# Patient Record
Sex: Male | Born: 1980
Health system: Southern US, Community
[De-identification: ages and names within clinical notes are randomized; demographics above are authoritative.]

## PROBLEM LIST (undated history)

## (undated) HISTORY — PX: MANDIBLE SURGERY: SHX707

## (undated) HISTORY — PX: ANKLE ARTHROSCOPY WITH OPEN REDUCTION INTERNAL FIXATION (ORIF): SHX5582

---

## 2009-09-26 ENCOUNTER — Ambulatory Visit: Payer: Self-pay | Admitting: Internal Medicine

## 2009-10-31 ENCOUNTER — Ambulatory Visit: Payer: Self-pay | Admitting: Internal Medicine

## 2011-04-15 ENCOUNTER — Ambulatory Visit: Payer: Self-pay | Admitting: Family Medicine

## 2011-08-19 ENCOUNTER — Ambulatory Visit: Payer: Self-pay

## 2011-08-19 LAB — RAPID STREP-A WITH REFLX: Micro Text Report: NEGATIVE

## 2011-08-21 LAB — BETA STREP CULTURE(ARMC)

## 2012-08-14 DIAGNOSIS — Z720 Tobacco use: Secondary | ICD-10-CM | POA: Insufficient documentation

## 2016-01-02 DIAGNOSIS — Z1389 Encounter for screening for other disorder: Secondary | ICD-10-CM | POA: Diagnosis not present

## 2016-01-02 DIAGNOSIS — Z720 Tobacco use: Secondary | ICD-10-CM | POA: Diagnosis not present

## 2016-01-02 DIAGNOSIS — Z13220 Encounter for screening for lipoid disorders: Secondary | ICD-10-CM | POA: Diagnosis not present

## 2016-01-02 DIAGNOSIS — G8929 Other chronic pain: Secondary | ICD-10-CM | POA: Diagnosis not present

## 2016-01-02 DIAGNOSIS — E669 Obesity, unspecified: Secondary | ICD-10-CM | POA: Diagnosis not present

## 2016-01-02 DIAGNOSIS — M25511 Pain in right shoulder: Secondary | ICD-10-CM | POA: Diagnosis not present

## 2016-01-03 DIAGNOSIS — M25512 Pain in left shoulder: Secondary | ICD-10-CM | POA: Diagnosis not present

## 2017-05-08 DIAGNOSIS — M5441 Lumbago with sciatica, right side: Secondary | ICD-10-CM | POA: Diagnosis not present

## 2017-05-08 DIAGNOSIS — F411 Generalized anxiety disorder: Secondary | ICD-10-CM | POA: Diagnosis not present

## 2017-06-12 DIAGNOSIS — F411 Generalized anxiety disorder: Secondary | ICD-10-CM | POA: Diagnosis not present

## 2017-06-12 DIAGNOSIS — Z13220 Encounter for screening for lipoid disorders: Secondary | ICD-10-CM | POA: Diagnosis not present

## 2017-06-12 DIAGNOSIS — Z131 Encounter for screening for diabetes mellitus: Secondary | ICD-10-CM | POA: Diagnosis not present

## 2017-06-12 DIAGNOSIS — Z79899 Other long term (current) drug therapy: Secondary | ICD-10-CM | POA: Diagnosis not present

## 2018-11-20 ENCOUNTER — Observation Stay
Admission: EM | Admit: 2018-11-20 | Discharge: 2018-11-22 | Disposition: A | Discharge status: Home / Self Care | Payer: No Typology Code available for payment source | Attending: Surgery | Admitting: Surgery

## 2018-11-20 ENCOUNTER — Other Ambulatory Visit: Payer: Self-pay

## 2018-11-20 ENCOUNTER — Observation Stay: Payer: No Typology Code available for payment source | Admitting: Anesthesiology

## 2018-11-20 ENCOUNTER — Observation Stay: Payer: No Typology Code available for payment source

## 2018-11-20 ENCOUNTER — Emergency Department: Payer: No Typology Code available for payment source

## 2018-11-20 ENCOUNTER — Encounter: Payer: Self-pay | Admitting: Emergency Medicine

## 2018-11-20 ENCOUNTER — Encounter
Admission: EM | Disposition: A | Discharge status: Home / Self Care | Payer: Self-pay | Source: Home / Self Care | Attending: Emergency Medicine

## 2018-11-20 DIAGNOSIS — Z87891 Personal history of nicotine dependence: Secondary | ICD-10-CM | POA: Insufficient documentation

## 2018-11-20 DIAGNOSIS — K760 Fatty (change of) liver, not elsewhere classified: Secondary | ICD-10-CM | POA: Insufficient documentation

## 2018-11-20 DIAGNOSIS — K8064 Calculus of gallbladder and bile duct with chronic cholecystitis without obstruction: Secondary | ICD-10-CM | POA: Diagnosis not present

## 2018-11-20 DIAGNOSIS — K8050 Calculus of bile duct without cholangitis or cholecystitis without obstruction: Secondary | ICD-10-CM

## 2018-11-20 DIAGNOSIS — K8020 Calculus of gallbladder without cholecystitis without obstruction: Secondary | ICD-10-CM

## 2018-11-20 DIAGNOSIS — K810 Acute cholecystitis: Secondary | ICD-10-CM | POA: Diagnosis present

## 2018-11-20 DIAGNOSIS — R109 Unspecified abdominal pain: Secondary | ICD-10-CM | POA: Diagnosis present

## 2018-11-20 DIAGNOSIS — R1010 Upper abdominal pain, unspecified: Secondary | ICD-10-CM

## 2018-11-20 HISTORY — PX: ERCP: SHX5425

## 2018-11-20 LAB — COMPREHENSIVE METABOLIC PANEL
ALT: 288 U/L — ABNORMAL HIGH (ref 0–44)
AST: 260 U/L — ABNORMAL HIGH (ref 15–41)
Albumin: 4.3 g/dL (ref 3.5–5.0)
Alkaline Phosphatase: 78 U/L (ref 38–126)
Anion gap: 8 (ref 5–15)
BUN: 19 mg/dL (ref 6–20)
CO2: 26 mmol/L (ref 22–32)
Calcium: 9 mg/dL (ref 8.9–10.3)
Chloride: 107 mmol/L (ref 98–111)
Creatinine, Ser: 1.02 mg/dL (ref 0.61–1.24)
GFR calc Af Amer: >60 mL/min (ref >60)
GFR calc non Af Amer: >60 mL/min (ref >60)
Glucose, Bld: 134 mg/dL — ABNORMAL HIGH (ref 70–99)
Potassium: 3.8 mmol/L (ref 3.5–5.1)
Sodium: 141 mmol/L (ref 135–145)
Total Bilirubin: 1.7 mg/dL — ABNORMAL HIGH (ref 0.3–1.2)
Total Protein: 7.2 g/dL (ref 6.5–8.1)

## 2018-11-20 LAB — CBC
HCT: 43.5 % (ref 39.0–52.0)
Hemoglobin: 14.4 g/dL (ref 13.0–17.0)
MCH: 29.3 pg (ref 26.0–34.0)
MCHC: 33.1 g/dL (ref 30.0–36.0)
MCV: 88.4 fL (ref 80.0–100.0)
Platelets: 277 10*3/uL (ref 150–400)
RBC: 4.92 MIL/uL (ref 4.22–5.81)
RDW: 12.5 % (ref 11.5–15.5)
WBC: 6.1 10*3/uL (ref 4.0–10.5)
nRBC: 0 % (ref 0.0–0.2)

## 2018-11-20 LAB — URINALYSIS, COMPLETE (UACMP) WITH MICROSCOPIC
Bacteria, UA: NONE SEEN
Bilirubin Urine: NEGATIVE
Glucose, UA: NEGATIVE mg/dL
Hgb urine dipstick: NEGATIVE
Ketones, ur: NEGATIVE mg/dL
Leukocytes,Ua: NEGATIVE
Nitrite: NEGATIVE
Protein, ur: NEGATIVE mg/dL
Specific Gravity, Urine: 1.021 (ref 1.005–1.030)
Squamous Epithelial / LPF: NONE SEEN (ref 0–5)
pH: 7 (ref 5.0–8.0)

## 2018-11-20 LAB — SURGICAL PCR SCREEN
MRSA, PCR: NEGATIVE
Staphylococcus aureus: POSITIVE — AB

## 2018-11-20 LAB — LIPASE, BLOOD: Lipase: 41 U/L (ref 11–51)

## 2018-11-20 SURGERY — ERCP, WITH INTERVENTION IF INDICATED
Anesthesia: General

## 2018-11-20 MED ORDER — SODIUM CHLORIDE 0.9 % IV BOLUS
1000.0000 mL | Freq: Once | INTRAVENOUS | Status: AC
  Administered 2018-11-20: 1000 mL via INTRAVENOUS

## 2018-11-20 MED ORDER — INDOMETHACIN 50 MG RE SUPP
100.0000 mg | Freq: Once | RECTAL | Status: AC
  Administered 2018-11-20: 15:00:00 100 mg via RECTAL

## 2018-11-20 MED ORDER — HYDROMORPHONE HCL 1 MG/ML IJ SOLN
0.5000 mg | Freq: Once | INTRAMUSCULAR | Status: AC
  Administered 2018-11-20: 0.5 mg via INTRAVENOUS
  Filled 2018-11-20: qty 1

## 2018-11-20 MED ORDER — GADOBUTROL 1 MMOL/ML IV SOLN
8.0000 mL | Freq: Once | INTRAVENOUS | Status: AC | PRN
  Administered 2018-11-20: 8 mL via INTRAVENOUS

## 2018-11-20 MED ORDER — DOCUSATE SODIUM 100 MG PO CAPS: 100.0000 mg | ORAL_CAPSULE | Freq: Two times a day (BID) | ORAL | Status: DC | PRN

## 2018-11-20 MED ORDER — LIDOCAINE HCL (CARDIAC) PF 100 MG/5ML IV SOSY
PREFILLED_SYRINGE | INTRAVENOUS | Status: DC | PRN
  Administered 2018-11-20: 60 mg via INTRAVENOUS

## 2018-11-20 MED ORDER — ONDANSETRON HCL 4 MG/2ML IJ SOLN
4.0000 mg | Freq: Once | INTRAMUSCULAR | Status: AC
  Administered 2018-11-20: 4 mg via INTRAVENOUS
  Filled 2018-11-20: qty 2

## 2018-11-20 MED ORDER — PROPOFOL 500 MG/50ML IV EMUL
INTRAVENOUS | Status: DC | PRN
  Administered 2018-11-20: 150 ug/kg/min via INTRAVENOUS

## 2018-11-20 MED ORDER — SODIUM CHLORIDE 0.9 % IV SOLN: INTRAVENOUS | Status: DC

## 2018-11-20 MED ORDER — LACTATED RINGERS IV SOLN
INTRAVENOUS | Status: DC
  Administered 2018-11-20: 13:00:00 via INTRAVENOUS

## 2018-11-20 MED ORDER — LACTATED RINGERS IV SOLN
INTRAVENOUS | Status: DC
  Administered 2018-11-20 – 2018-11-21 (×3): via INTRAVENOUS

## 2018-11-20 MED ORDER — PIPERACILLIN-TAZOBACTAM 3.375 G IVPB 30 MIN
3.3750 g | Freq: Once | INTRAVENOUS | Status: AC
  Administered 2018-11-20: 3.375 g via INTRAVENOUS
  Filled 2018-11-20: qty 50

## 2018-11-20 MED ORDER — PROPOFOL 10 MG/ML IV BOLUS
INTRAVENOUS | Status: AC
  Filled 2018-11-20: qty 20

## 2018-11-20 MED ORDER — LIDOCAINE HCL (PF) 2 % IJ SOLN
INTRAMUSCULAR | Status: AC
  Filled 2018-11-20: qty 10

## 2018-11-20 MED ORDER — SODIUM CHLORIDE 0.9 % IV SOLN
INTRAVENOUS | Status: DC | PRN
  Administered 2018-11-20 – 2018-11-21 (×2): via INTRAVENOUS

## 2018-11-20 MED ORDER — ONDANSETRON 4 MG PO TBDP: 4.0000 mg | ORAL_TABLET | Freq: Three times a day (TID) | ORAL | 0 refills | Status: DC | PRN

## 2018-11-20 MED ORDER — ONDANSETRON HCL 4 MG/2ML IJ SOLN: 4.0000 mg | Freq: Four times a day (QID) | INTRAMUSCULAR | Status: DC | PRN

## 2018-11-20 MED ORDER — FENTANYL CITRATE (PF) 100 MCG/2ML IJ SOLN
50.0000 ug | Freq: Once | INTRAMUSCULAR | Status: AC
  Administered 2018-11-20: 50 ug via INTRAVENOUS
  Filled 2018-11-20: qty 2

## 2018-11-20 MED ORDER — MORPHINE SULFATE (PF) 2 MG/ML IV SOLN
2.0000 mg | INTRAVENOUS | Status: DC | PRN
  Administered 2018-11-21 – 2018-11-22 (×4): 2 mg via INTRAVENOUS
  Filled 2018-11-20 (×4): qty 1

## 2018-11-20 MED ORDER — MIDAZOLAM HCL 2 MG/2ML IJ SOLN
INTRAMUSCULAR | Status: DC | PRN
  Administered 2018-11-20: 2 mg via INTRAVENOUS

## 2018-11-20 MED ORDER — MIDAZOLAM HCL 2 MG/2ML IJ SOLN
INTRAMUSCULAR | Status: AC
  Filled 2018-11-20: qty 2

## 2018-11-20 MED ORDER — INDOMETHACIN 50 MG RE SUPP
RECTAL | Status: AC
  Administered 2018-11-20: 100 mg via RECTAL
  Filled 2018-11-20: qty 2

## 2018-11-20 MED ORDER — PROPOFOL 10 MG/ML IV BOLUS
INTRAVENOUS | Status: DC | PRN
  Administered 2018-11-20 (×2): 40 mg via INTRAVENOUS

## 2018-11-20 MED ORDER — ONDANSETRON 4 MG PO TBDP
4.0000 mg | ORAL_TABLET | Freq: Four times a day (QID) | ORAL | Status: DC | PRN
  Filled 2018-11-20: qty 1

## 2018-11-20 MED ORDER — SODIUM CHLORIDE 0.9 % IV SOLN
2.0000 g | INTRAVENOUS | Status: DC
  Administered 2018-11-20: 18:00:00 2 g via INTRAVENOUS
  Filled 2018-11-20: qty 20
  Filled 2018-11-20: qty 2

## 2018-11-20 MED ORDER — OXYCODONE-ACETAMINOPHEN 5-325 MG PO TABS: 1.0000 | ORAL_TABLET | ORAL | 0 refills | Status: DC | PRN

## 2018-11-20 NOTE — ED Triage Notes (Addendum)
Patient ambulatory to triage with steady gait, without difficulty, appears uncomfortable; c/o generalized abd pain x 2wks, worse this morning radiating around into back with no accomp symptoms

## 2018-11-20 NOTE — Op Note (Signed)
[facility] Gastroenterology Patient Name: Steve FiddlerBrad Campbell Procedure Date: 11/20/2018 3:04 PM MRN: [id number] Account #: [id number] Date of Birth: 08-16-1980 Admit Type: Outpatient Age: 38 Room: [facility] ENDO ROOM 4 Gender: Male Note Status: Finalized Procedure:            ERCP Indications:          Bile duct stone(s) Providers:            Midge Miniumarren Derward Marple MD, MD Medicines:            Propofol per Anesthesia Complications:        No immediate complications. Procedure:            Pre-Anesthesia Assessment:                       - Prior to the procedure, a History and Physical was                        performed, and patient medications and allergies were                        reviewed. The patient's tolerance of previous                        anesthesia was also reviewed. The risks and benefits of                        the procedure and the sedation options and risks were                        discussed with the patient. All questions were                        answered, and informed consent was obtained. Prior                        Anticoagulants: The patient has taken no previous                        anticoagulant or antiplatelet agents. ASA Grade                        Assessment: II - A patient with mild systemic disease.                        After reviewing the risks and benefits, the patient was                        deemed in satisfactory condition to undergo the                        procedure.                       After obtaining informed consent, the scope was passed                        under direct vision. Throughout the procedure, the                        patient's blood pressure, pulse, and  oxygen saturations                        were monitored continuously. The Duodenoscope was                        introduced through the mouth, and used to inject                        contrast into and used to inject contrast into the bile         duct. The ERCP was accomplished without difficulty. The                        patient tolerated the procedure well. Findings:      The scout film was normal. The esophagus was successfully intubated       under direct vision. The scope was advanced to a normal major papilla in       the descending duodenum without detailed examination of the pharynx,       larynx and associated structures, and upper GI tract. The upper GI tract       was grossly normal. The bile duct was deeply cannulated with the       short-nosed traction sphincterotome. Contrast was injected. I personally       interpreted the bile duct images. There was brisk flow of contrast       through the ducts. Image quality was excellent. Contrast extended to the       entire biliary tree. The lower third of the main bile duct contained       three stones. A wire was passed into the biliary tree. A 7 mm biliary       sphincterotomy was made with a traction (standard) sphincterotome using       ERBE electrocautery. There was no post-sphincterotomy bleeding. The       biliary tree was swept with a 15 mm balloon starting at the bifurcation.       Three stones were removed. No stones remained. Impression:           - Choledocholithiasis was found. Complete removal was                        accomplished by biliary sphincterotomy and balloon                        extraction.                       - A biliary sphincterotomy was performed.                       - The biliary tree was swept. Recommendation:       - Return patient to hospital ward for ongoing care.                       - Continue present medications.                       - Clear liquid diet today.                       - Watch for pancreatitis, bleeding, perforation, and  cholangitis. Procedure Code(s):    --- Professional ---                       [phone], Endoscopic retrograde cholangiopancreatography                        (ERCP); with  removal of calculi/debris from                        biliary/pancreatic duct(s)                       [id number], Endoscopic retrograde cholangiopancreatography                        (ERCP); with sphincterotomy/papillotomy                       [phone], Endoscopic catheterization of the biliary ductal                        system, radiological supervision and interpretation Diagnosis Code(s):    --- Professional ---                       K80.50, Calculus of bile duct without cholangitis or                        cholecystitis without obstruction CPT copyright 2019 American [employer] Association. All rights reserved. The codes documented in this report are preliminary and upon coder review may  be revised to meet current compliance requirements. Midge Minium MD, MD 11/20/2018 3:55:04 PM This report has been signed electronically. Number of Addenda: 0 Note Initiated On: 11/20/2018 3:04 PM      [facility]

## 2018-11-20 NOTE — H&P (Signed)
Subjective:   CC: cholecystitis  HPI:  Steve Campbell is a 38 y.o. male [facility]  Symptoms were first noted 2 weeks ago. Pain is sharp, crampy, located in epigastric area.  Took some antacids and seems to have improved initial episode.  Intermittent pain until 5hrs ago, with acute exacerbation prompting ED visit.  Associated with nothing specific, exacerbated by nothing specific.  No URI symptoms, afebrile, and no contact with covid positive patient     Past Medical History: anxiety  Past Surgical History: no abdominal procedures  Family History: not related to CC  Social History: history of chewing tobacco  Current Medications: none reported  Allergies:  Allergies as of 11/20/2018  . (No Known Allergies)    ROS:  General: Denies weight loss, weight gain, fatigue, fevers, chills, and night sweats. Eyes: Denies blurry vision, double vision, eye pain, itchy eyes, and tearing. Ears: Denies hearing loss, earache, and ringing in ears. Nose: Denies sinus pain, congestion, infections, runny nose, and nosebleeds. Mouth/throat: Denies hoarseness, sore throat, bleeding gums, and difficulty swallowing. Heart: Denies chest pain, palpitations, racing heart, irregular heartbeat, leg pain or swelling, and decreased activity tolerance. Respiratory: Denies breathing difficulty, shortness of breath, wheezing, cough, and sputum. GI: Denies change in appetite, heartburn, nausea, vomiting, constipation, diarrhea, and blood in stool. GU: Denies difficulty urinating, pain with urinating, urgency, frequency, blood in urine. Musculoskeletal: Denies joint stiffness, pain, swelling, muscle weakness. Skin: Denies rash, itching, mass, tumors, sores, and boils Neurologic: Denies headache, fainting, dizziness, seizures, numbness, and tingling. Psychiatric: Denies depression, anxiety, difficulty sleeping, and memory loss. Endocrine: Denies heat or cold  intolerance, and increased thirst or urination. Blood/lymph: Denies easy bruising, easy bruising, and swollen glands     Objective:     BP (!) 136/57 (BP Location: Right Arm)   Pulse 65   Temp 97.9 F (36.6 C) (Oral)   Resp 18   Ht 5\' 10"  (1.778 m)   Wt 88.5 kg   SpO2 99%   BMI 27.98 kg/m    Constitutional :  alert, cooperative, appears stated age and no distress  Lymphatics/Throat:  no asymmetry, masses, or scars  Respiratory:  clear to auscultation bilaterally  Cardiovascular:  regular rate and rhythm  Gastrointestinal: soft, but moderate tenderness in epigastric area with some guarding.   Musculoskeletal: Steady gait and movement  Skin: Cool and moist  Psychiatric: Normal affect, non-agitated, not confused       LABS:  CMP Latest Ref Rng & Units 11/20/2018  Glucose 70 - 99 mg/dL 130(Q134(H)  BUN 6 - 20 mg/dL 19  Creatinine 6.570.61 - 8.461.24 mg/dL 9.621.02  Sodium 952135 - 841145 mmol/L 141  Potassium 3.5 - 5.1 mmol/L 3.8  Chloride 98 - 111 mmol/L 107  CO2 22 - 32 mmol/L 26  Calcium 8.9 - 10.3 mg/dL 9.0  Total Protein 6.5 - 8.1 g/dL 7.2  Total Bilirubin 0.3 - 1.2 mg/dL 3.2(G1.7(H)  Alkaline Phos 38 - 126 U/L 78  AST 15 - 41 U/L 260(H)  ALT 0 - 44 U/L 288(H)   CBC Latest Ref Rng & Units 11/20/2018  WBC 4.0 - 10.5 K/uL 6.1  Hemoglobin 13.0 - 17.0 g/dL 40.114.4  Hematocrit 02.739.0 - 52.0 % 43.5  Platelets 150 - 400 K/uL 277     RADS: CLINICAL DATA:  38 year old male with epigastric and right upper quadrant pain.  EXAM: ULTRASOUND ABDOMEN LIMITED RIGHT UPPER QUADRANT  COMPARISON:  None.  FINDINGS: Gallbladder:  Numerous  shadowing echogenic gallstones. Multiple gallstones in the gallbladder neck (image 20) individually estimated at 13 millimeters diameter (image 4). Superimposed sludge. Gallbladder wall thickness remains normal at 2 millimeters. No sonographic Murphy sign elicited. No pericholecystic fluid.  Common bile duct:  Diameter: 5 millimeters,  normal.  Liver:  Increased liver echogenicity (image 32). No discrete liver lesion. No intrahepatic biliary ductal dilatation. Portal vein is patent on color Doppler imaging with normal direction of blood flow towards the liver.  Other findings: Negative visible right kidney.  IMPRESSION: 1. Positive for cholelithiasis with multiple gallstones in the gallbladder neck. No strong evidence of acute cholecystitis at this time. 2. CBD remains within normal limits, no evidence of bile duct obstruction. 3. Evidence of hepatic steatosis.   Electronically Signed   By: Odessa Fleming M.D.   On: 11/20/2018 06:40 Assessment:      Acute cholecystitis, possible choledocolithiasis  Plan:     Due to elevated LFTs, will obtain MRCP to ensure no CBD stones prior to lap chole.  GI consult if positive MRCP   Discussed the risk of surgery including post-op infxn, seroma, biloma, chronic pain, poor-delayed wound healing, retained gallstone, conversion to open procedure, post-op SBO or ileus, and need for additional procedures to address said risks.  The risks of general anesthetic including MI, CVA, sudden death or even reaction to anesthetic medications also discussed. Alternatives include continued observation.  Benefits include possible symptom relief, prevention of complications including acute cholecystitis, pancreatitis.  Typical post operative recovery of 3-5 days rest, continued pain in area and incision sites, possible loose stools up to 4-6 weeks, also discussed.  The patient understands the risks, any and all questions were answered to the patient's satisfaction.

## 2018-11-20 NOTE — Anesthesia Postprocedure Evaluation (Signed)
Anesthesia Post Note  Patient: Steve Campbell  Procedure(s) Performed: ENDOSCOPIC RETROGRADE CHOLANGIOPANCREATOGRAPHY (ERCP) (N/A )  Patient location during evaluation: PACU Anesthesia Type: General Level of consciousness: awake and alert Pain management: pain level controlled Vital Signs Assessment: post-procedure vital signs reviewed and stable Respiratory status: spontaneous breathing, nonlabored ventilation, respiratory function stable and patient connected to nasal cannula oxygen Cardiovascular status: blood pressure returned to baseline and stable Postop Assessment: no apparent nausea or vomiting Anesthetic complications: no     Last Vitals:  Vitals:   11/20/18 1735 11/20/18 2045  BP:  113/66  Pulse: (!) 47 (!) 51  Resp:  15  Temp:  36.7 C  SpO2:  97%    Last Pain:  Vitals:   11/20/18 2045  TempSrc: Oral  PainSc:                  Kiyani Jernigan S

## 2018-11-20 NOTE — ED Notes (Signed)
ED TO INPATIENT HANDOFF REPORT  ED Nurse Name and Phone #: Trenda Corliss [phone]  S Name/Age/Gender Steve Campbell 38 y.o. male Room/Bed: ED04A/ED04A  Code Status   Code Status: Not on file  Home/SNF/Other Home Patient oriented to: self, place, time and situation Is this baseline? Yes   Triage Complete: Triage complete  Chief Complaint abdominal Pain  Triage Note Patient ambulatory to triage with steady gait, without difficulty, appears uncomfortable; c/o generalized abd pain x 2wks, worse this morning radiating around into back with no accomp symptoms   Allergies No Known Allergies  Level of Care/Admitting Diagnosis ED Disposition    ED Disposition Condition Comment   Admit  [facility]ospital [facility] [[id number]]  Level of Care: Med-Surg [16]  Covid Evaluation: N/A  Diagnosis: Acute cholecystitis [575.0.ICD-9-CM]  Admitting Physician: Sung AmabileSAKAI, ISAMI [[id number]]  Attending Physician: Sung AmabileSAKAI, ISAMI [phone][[id number]]  PT Class (Do Not Modify): Observation [104]  PT Acc Code (Do Not Modify): Observation [10022]       B Medical/Surgery History History reviewed. No pertinent past medical history. History reviewed. No pertinent surgical history.   A IV Location/Drains/Wounds Patient Lines/Drains/Airways Status   Active Line/Drains/Airways    Name:   Placement date:   Placement time:   Site:   Days:   Peripheral IV 11/20/18 Left Hand   11/20/18    0547    Hand   less than 1          Intake/Output Last 24 hours  Intake/Output Summary (Last 24 hours) at 11/20/2018 1015 Last data filed at 11/20/2018 0912 Gross per 24 hour  Intake 1000 ml  Output -  Net 1000 ml    Labs/Imaging Results for orders placed or performed during the hospital encounter of 11/20/18 (from the past 48 hour(s))  CBC     Status: None   Collection Time: 11/20/18  5:10 AM  Result Value Ref Range   WBC 6.1 4.0 - 10.5 K/uL   RBC 4.92 4.22 - 5.81 MIL/uL   Hemoglobin 14.4 13.0 - 17.0 g/dL   HCT  19.143.5 47.839.0 - 29.552.0 %   MCV 88.4 80.0 - 100.0 fL   MCH 29.3 26.0 - 34.0 pg   MCHC 33.1 30.0 - 36.0 g/dL   RDW 62.112.5 30.811.5 - 65.715.5 %   Platelets 277 150 - 400 K/uL   nRBC 0.0 0.0 - 0.2 %    Comment: Performed at [facility], [address] [place], [place] [address]  Urinalysis, Complete w Microscopic     Status: Abnormal   Collection Time: 11/20/18  5:10 AM  Result Value Ref Range   Color, Urine YELLOW (A) YELLOW   APPearance CLOUDY (A) CLEAR   Specific Gravity, Urine 1.021 1.005 - 1.030   pH 7.0 5.0 - 8.0   Glucose, UA NEGATIVE NEGATIVE mg/dL   Hgb urine dipstick NEGATIVE NEGATIVE   Bilirubin Urine NEGATIVE NEGATIVE   Ketones, ur NEGATIVE NEGATIVE mg/dL   Protein, ur NEGATIVE NEGATIVE mg/dL   Nitrite NEGATIVE NEGATIVE   Leukocytes,Ua NEGATIVE NEGATIVE   WBC, UA 0-5 0 - 5 WBC/hpf   Bacteria, UA NONE SEEN NONE SEEN   Squamous Epithelial / LPF NONE SEEN 0 - 5   Mucus PRESENT    Amorphous Crystal PRESENT     Comment: Performed at [facility], [address] [place], [place] [address]  Comprehensive metabolic panel     Status: Abnormal   Collection Time: 11/20/18  6:47 AM  Result Value Ref Range  Sodium 141 135 - 145 mmol/L   Potassium 3.8 3.5 - 5.1 mmol/L   Chloride 107 98 - 111 mmol/L   CO2 26 22 - 32 mmol/L   Glucose, Bld 134 (H) 70 - 99 mg/dL   BUN 19 6 - 20 mg/dL   Creatinine, Ser 1.61 0.61 - 1.24 mg/dL   Calcium 9.0 8.9 - 09.6 mg/dL   Total Protein 7.2 6.5 - 8.1 g/dL   Albumin 4.3 3.5 - 5.0 g/dL   AST 045 (H) 15 - 41 U/L   ALT 288 (H) 0 - 44 U/L   Alkaline Phosphatase 78 38 - 126 U/L   Total Bilirubin 1.7 (H) 0.3 - 1.2 mg/dL   GFR calc non Af Amer >60 >60 mL/min   GFR calc Af Amer >60 >60 mL/min   Anion gap 8 5 - 15    Comment: Performed at [facility], [address] [place], [place] [address]  Lipase, blood     Status: None   Collection Time: 11/20/18  6:47 AM  Result Value Ref Range   Lipase 41 11 - 51 U/L    Comment: Performed  at [facility], [address], [place], [place] [address]   US Abdomen Limited Ruq  Result Date: 11/20/2018 CLINICAL DATA:  38 year old male with epigastric and right upper quadrant pain. EXAM: ULTRASOUND ABDOMEN LIMITED RIGHT UPPER QUADRANT COMPARISON:  None. FINDINGS: Gallbladder: Numerous shadowing echogenic gallstones. Multiple gallstones in the gallbladder neck (image 20) individually estimated at 13 millimeters diameter (image 4). Superimposed sludge. Gallbladder wall thickness remains normal at 2 millimeters. No sonographic Murphy sign elicited. No pericholecystic fluid. Common bile duct: Diameter: 5 millimeters, normal. Liver: Increased liver echogenicity (image 32). No discrete liver lesion. No intrahepatic biliary ductal dilatation. Portal vein is patent on color Doppler imaging with normal direction of blood flow towards the liver. Other findings: Negative visible right kidney. IMPRESSION: 1. Positive for cholelithiasis with multiple gallstones in the gallbladder neck. No strong evidence of acute cholecystitis at this time. 2. CBD remains within normal limits, no evidence of bile duct obstruction. 3. Evidence of hepatic steatosis. Electronically Signed   By: Odessa Fleming M.D.   On: 11/20/2018 06:40    Pending Labs [employer] (From admission, onward)    Start     Ordered   Signed and Held  HIV antibody (Routine Testing)  Once,   R     Signed and Held   Signed and Interior and spatial designer  Daily,   R     Signed and Held   Signed and Held  Magnesium  Daily,   R     Signed and Held   Signed and Held  Phosphorus  Daily,   R     Signed and Held   Signed and Held  CBC  Daily,   R     Signed and Held   Signed and Held  Hepatic function panel  Daily,   R     Signed and Held          Vitals/Pain Today's Vitals   11/20/18 0823 11/20/18 0830 11/20/18 0900 11/20/18 1005  BP:  124/65 116/74   Pulse:  71 77   Resp:      Temp:      TempSrc:      SpO2:  99% 97%   Weight:       Height:      PainSc: 4    0-No pain  Isolation Precautions No active isolations  Medications Medications  sodium chloride 0.9 % bolus 1,000 mL (0 mLs Intravenous Stopped 11/20/18 0912)  fentaNYL (SUBLIMAZE) injection 50 mcg (50 mcg Intravenous Given 11/20/18 0548)  ondansetron (ZOFRAN) injection 4 mg (4 mg Intravenous Given 11/20/18 0553)  HYDROmorphone (DILAUDID) injection 0.5 mg (0.5 mg Intravenous Given 11/20/18 0819)  piperacillin-tazobactam (ZOSYN) IVPB 3.375 g (0 g Intravenous Stopped 11/20/18 0912)    Mobility walks Low fall risk   Focused Assessments    R Recommendations: See Admitting Provider Note  Report given to:   Additional Notes:

## 2018-11-20 NOTE — ED Notes (Addendum)
Pt returned from ultrasound. Resting on stretcher quietly. States his pain has improved. No distress noted. Provided for safety and comfort and will continue to assess.

## 2018-11-20 NOTE — ED Notes (Signed)
Patient pacing around room. Patient made aware he cannot ambulate around room once given dilaudid. Patient reports understanding/agreed

## 2018-11-20 NOTE — Progress Notes (Signed)
   11/20/18 1300  Clinical Encounter Type  Visited With Patient  Visit Type Initial  Ch was rounding. Pt said he was in pain but doing better since coming to the hospital. Pt has good family support. Ch let him know the chaplain service was available at all times.

## 2018-11-20 NOTE — Consult Note (Signed)
Midge Minium, MD [facility]  [address] [place], [place] [address] Phone: [phone] Fax : [phone]  Consultation  Referring Provider:     Dr. Tonna Boehringer Primary Care Physician:  Nira Retort Primary Gastroenterologist: Gentry Fitz          Reason for Consultation:     Choledocholithiasis  Date of Admission:  11/20/2018 Date of Consultation:  11/20/2018         HPI:   Steve Campbell is a 38 y.o. male who was admitted for sharp crampy pain in the epigastric area.  The patient was noted to have abnormal liver enzymes and was sent for an MRCP.  The MRCP showed gallstones without any sign of acute cholecystitis but there was biliary ductal dilatation with 2 tiny subcentimeter stones within the common bile duct.  The patient reports that he was having intermittent pain prior to coming to the ER.  The patient's liver enzymes showed an AST of 260 with an ALT of 288 and a total bilirubin of 1.7.  The patient denies ever having symptoms like this before.  Lipase was normal at 41.  Now being asked to see the patient for an ERCP.  History reviewed. No pertinent past medical history.  History reviewed. No pertinent surgical history.  Prior to Admission medications   Medication Sig Start Date End Date Taking? Authorizing Provider  ondansetron (ZOFRAN ODT) 4 MG disintegrating tablet Take 1 tablet (4 mg total) by mouth every 8 (eight) hours as needed for nausea or vomiting. 11/20/18   Irean Hong, MD  oxyCODONE-acetaminophen (PERCOCET/ROXICET) 5-325 MG tablet Take 1 tablet by mouth every 4 (four) hours as needed for severe pain. 11/20/18   Irean Hong, MD    History reviewed. No pertinent family history.   Social History   Tobacco Use   Smoking status: Not on file  Substance Use Topics   Alcohol use: Not on file   Drug use: Not on file    Allergies as of 11/20/2018   (No Known Allergies)    Review of Systems:    All systems reviewed and negative except where noted in  HPI.   Physical Exam:  Vital signs in last 24 hours: Temp:  [97.7 F (36.5 C)-97.9 F (36.6 C)] 97.7 F (36.5 C) (04/23 1104) Pulse Rate:  [57-78] 58 (04/23 1104) Resp:  [16-22] 16 (04/23 1104) BP: (116-143)/(57-93) 131/76 (04/23 1104) SpO2:  [97 %-100 %] 99 % (04/23 1104) Weight:  [88.5 kg] 88.5 kg (04/23 0446)   General:   Pleasant, cooperative in NAD Head:  Normocephalic and atraumatic. Eyes:   No icterus.   Conjunctiva pink. PERRLA. Ears:  Normal auditory acuity. Neck:  Supple; no masses or thyroidomegaly Lungs: Respirations even and unlabored. Lungs clear to auscultation bilaterally.   No wheezes, crackles, or rhonchi.  Heart:  Regular rate and rhythm;  Without murmur, clicks, rubs or gallops Abdomen:  Soft, nondistended, nontender. Normal bowel sounds. No appreciable masses or hepatomegaly.  No rebound or guarding.  Rectal:  Not performed. Msk:  Symmetrical without gross deformities.    Extremities:  Without edema, cyanosis or clubbing. Neurologic:  Alert and oriented x3;  grossly normal neurologically. Skin:  Intact without significant lesions or rashes. Cervical Nodes:  No significant cervical adenopathy. Psych:  Alert and cooperative. Normal affect.  LAB RESULTS: Recent Labs    11/20/18 0510  WBC 6.1  HGB 14.4  HCT 43.5  PLT 277   BMET Recent Labs    11/20/18  0647  NA 141  K 3.8  CL 107  CO2 26  GLUCOSE 134*  BUN 19  CREATININE 1.02  CALCIUM 9.0   LFT Recent Labs    11/20/18 0647  PROT 7.2  ALBUMIN 4.3  AST 260*  ALT 288*  ALKPHOS 78  BILITOT 1.7*   PT/INR No results for input(s): LABPROT, INR in the last 72 hours.  STUDIES: Mr Abdomen Mrcp Vivien RossettiW Wo Contast  Result Date: 11/20/2018 CLINICAL DATA:  Right upper quadrant pain. Cholelithiasis. Elevated liver function tests. EXAM: MRI ABDOMEN WITHOUT AND WITH CONTRAST (INCLUDING MRCP) TECHNIQUE: Multiplanar multisequence MR imaging of the abdomen was performed both before and after the  administration of intravenous contrast. Heavily T2-weighted images of the biliary and pancreatic ducts were obtained, and three-dimensional MRCP images were rendered by post processing. CONTRAST:  8 mL Gadavist COMPARISON:  None. FINDINGS: Lower chest: No acute findings. Hepatobiliary: No hepatic masses identified. Normal hepatic signal intensity. Numerous tiny gallstones are seen nearly completely filling the gallbladder, however there is no evidence of gallbladder wall thickening or pericholecystic inflammatory changes. Some respiratory motion artifact is noted. Mild dilatation of common bile duct is seen measuring 6 mm in diameter. Two sub-cm calculi are seen within the distal common bile duct. Pancreas: No mass or inflammatory changes. No evidence of pancreatic ductal dilatation or pancreas divisum. Spleen:  Within normal limits in size and appearance. Adrenals/Urinary Tract: No masses identified. No evidence of hydronephrosis. Stomach/Bowel: Visualized portion unremarkable. Vascular/Lymphatic: No pathologically enlarged lymph nodes identified. No abdominal aortic aneurysm. Other:  None. Musculoskeletal:  No suspicious bone lesions identified. IMPRESSION: Cholelithiasis.  No radiographic evidence of acute cholecystitis. Mild biliary ductal dilatation measuring 6 mm, with 2 tiny sub-cm calculi within the distal common bile duct. Electronically Signed   By: Myles RosenthalJohn  Stahl M.D.   On: 11/20/2018 12:21   [place] Abdomen Limited Ruq  Result Date: 11/20/2018 CLINICAL DATA:  38 year old male with epigastric and right upper quadrant pain. EXAM: ULTRASOUND ABDOMEN LIMITED RIGHT UPPER QUADRANT COMPARISON:  None. FINDINGS: Gallbladder: Numerous shadowing echogenic gallstones. Multiple gallstones in the gallbladder neck (image 20) individually estimated at 13 millimeters diameter (image 4). Superimposed sludge. Gallbladder wall thickness remains normal at 2 millimeters. No sonographic Murphy sign elicited. No pericholecystic  fluid. Common bile duct: Diameter: 5 millimeters, normal. Liver: Increased liver echogenicity (image 32). No discrete liver lesion. No intrahepatic biliary ductal dilatation. Portal vein is patent on color Doppler imaging with normal direction of blood flow towards the liver. Other findings: Negative visible right kidney. IMPRESSION: 1. Positive for cholelithiasis with multiple gallstones in the gallbladder neck. No strong evidence of acute cholecystitis at this time. 2. CBD remains within normal limits, no evidence of bile duct obstruction. 3. Evidence of hepatic steatosis. Electronically Signed   By: Odessa FlemingH  Hall M.D.   On: 11/20/2018 06:40      Impression / Plan:   Assessment: Active Problems:   Acute cholecystitis Common bile duct stones and gallstones  Steve Campbell is a 38 y.o. y/o male with a positive MRCP showing 2 stones in the common bile duct.  The patient is relatively pain-free at the present time.    Plan: The patient will be set up for an ERCP for today to remove those 2 stones seen on the MRCP.  The patient has been told of the risks including bleeding perforation pancreatitis and inability to cannulate the common bile duct.  He has also been informed that severe pancreatitis can be life-threatening.  The patient  has agreed to proceed with the procedure.   Thank you for involving me in the care of this patient.      LOS: 0 days   Midge Minium, MD  11/20/2018, 2:57 PM    Note: This dictation was prepared with Dragon dictation along with smaller phrase technology. Any transcriptional errors that result from this process are unintentional.

## 2018-11-20 NOTE — ED Notes (Signed)
Pt up to restroom without assistance. C/o severe abd pain. Restless.

## 2018-11-20 NOTE — Progress Notes (Signed)
   11/20/18 1735  Vitals  Pulse Rate (!) 47   Dr. Tonna Boehringer made aware that patient HR is staying in the 40s since patient return from ERCP.  Patient is asymptomatic. Verbal order for cardiac monitoring.  Orson Ape, BSN

## 2018-11-20 NOTE — ED Notes (Signed)
Dr. Dolores Frame at the bedside for pt evaluation. Pt very restless.

## 2018-11-20 NOTE — Anesthesia Preprocedure Evaluation (Signed)
Anesthesia Evaluation  Patient identified by MRN, date of birth, ID band Patient awake    Reviewed: Allergy & Precautions, NPO status , Patient's Chart, lab work & pertinent test results, reviewed documented beta blocker date and time   Airway Mallampati: II  TM Distance: >3 FB     Dental  (+) Chipped   Pulmonary           Cardiovascular      Neuro/Psych    GI/Hepatic   Endo/Other    Renal/GU      Musculoskeletal   Abdominal   Peds  Hematology   Anesthesia Other Findings   Reproductive/Obstetrics                             Anesthesia Physical Anesthesia Plan  ASA: II  Anesthesia Plan: General   Post-op Pain Management:    Induction: Intravenous  PONV Risk Score and Plan:   Airway Management Planned: Oral ETT  Additional Equipment:   Intra-op Plan:   Post-operative Plan:   Informed Consent: I have reviewed the patients History and Physical, chart, labs and discussed the procedure including the risks, benefits and alternatives for the proposed anesthesia with the patient or authorized representative who has indicated his/her understanding and acceptance.     Plan Discussed with: CRNA  Anesthesia Plan Comments:         Anesthesia Quick Evaluation  

## 2018-11-20 NOTE — Discharge Instructions (Addendum)
Laparoscopic Cholecystectomy, Care After This sheet gives you information about how to care for yourself after your procedure. Your doctor may also give you more specific instructions. If you have problems or questions, contact your doctor. Follow these instructions at home: Care for cuts from surgery (incisions)   Follow instructions from your doctor about how to take care of your cuts from surgery. Make sure you: ? Wash your hands with soap and water before you change your bandage (dressing). If you cannot use soap and water, use hand sanitizer. ? Change your bandage as told by your doctor. ? Leave stitches (sutures), skin glue, or skin tape (adhesive) strips in place. They may need to stay in place for 2 weeks or longer. If tape strips get loose and curl up, you may trim the loose edges. Do not remove tape strips completely unless your doctor says it is okay.  Do not take baths, swim, or use a hot tub until your doctor says it is okay. OK TO SHOWER IN 48HRS.  Check your surgical cut area every day for signs of infection. Check for: ? More redness, swelling, or pain. ? More fluid or blood. ? Warmth. ? Pus or a bad smell. Activity  Do not drive or use heavy machinery while taking prescription pain medicine.  Do not play contact sports until your doctor says it is okay.  Do not drive for 24 hours if you were given a medicine to help you relax (sedative).  Rest as needed. Do not return to work or school until your doctor says it is okay.  General instructions   Neurontin and advil as needed for discomfort.  Simethicone for your gas pain.   Use narcotics, if prescribed, only when neurontin and motrin is not enough to control pain.   Advil up to 800mg  per dose every 8hrs as needed for pain.    To prevent or treat constipation while you are taking prescription pain medicine, your doctor may recommend that you: ? Drink enough fluid to keep your pee (urine) clear or pale yellow. ? Take  over-the-counter or prescription medicines. ? Eat foods that are high in fiber, such as fresh fruits and vegetables, whole grains, and beans. ? Limit foods that are high in fat and processed sugars, such as fried and sweet foods. Contact a doctor if:  You develop a rash.  You have more redness, swelling, or pain around your surgical cuts.  You have more fluid or blood coming from your surgical cuts.  Your surgical cuts feel warm to the touch.  You have pus or a bad smell coming from your surgical cuts.  You have a fever.  One or more of your surgical cuts breaks open. Get help right away if:  You have trouble breathing.  You have chest pain.  You have pain that is getting worse in your shoulders.  You faint or feel dizzy when you stand.  You have very bad pain in your belly (abdomen).  You are sick to your stomach (nauseous) for more than one day.  You have throwing up (vomiting) that lasts for more than one day.  You have leg pain. This information is not intended to replace advice given to you by your health care provider. Make sure you discuss any questions you have with your health care provider. Document Released: 04/24/2008 Document Revised: 02/04/2016 Document Reviewed: 01/02/2016 Elsevier Interactive Patient Education  2019 [employer].

## 2018-11-20 NOTE — ED Provider Notes (Signed)
Recurrence of pain with market elevation in his LFTs.  I have discussed with general surgery for cholecystectomy.   Emily Filbert, MD 11/20/18 [phone]

## 2018-11-20 NOTE — Anesthesia Post-op Follow-up Note (Signed)
Anesthesia QCDR form completed.        

## 2018-11-20 NOTE — ED Provider Notes (Signed)
[facility] Emergency Department Provider Note   ____________________________________________   First MD Initiated Contact with Patient 11/20/18 [phone]     (approximate)  I have reviewed the triage vital signs and the nursing notes.   HISTORY  Chief Complaint Abdominal Pain    HPI Steve Campbell is a 38 y.o. male who presents to the ED from home with a chief complaint of abdominal pain.  Patient awoke around 3 AM with epigastric/right upper quadrant abdominal pain radiating to his flank.  Had a similar episode approximately 2 weeks ago which went away without intervention.  Denies associated fever, cough, chest pain, shortness of breath, nausea or vomiting.  Last bowel movement was yesterday which was normal for patient.  Denies recent travel, trauma or exposure to persons diagnosed with coronavirus.       Past medical history None  There are no active problems to display for this patient.   History reviewed. No pertinent surgical history.  Prior to Admission medications   Medication Sig Start Date End Date Taking? Authorizing Provider  ondansetron (ZOFRAN ODT) 4 MG disintegrating tablet Take 1 tablet (4 mg total) by mouth every 8 (eight) hours as needed for nausea or vomiting. 11/20/18   Irean Hong, MD  oxyCODONE-acetaminophen (PERCOCET/ROXICET) 5-325 MG tablet Take 1 tablet by mouth every 4 (four) hours as needed for severe pain. 11/20/18   Irean Hong, MD    Allergies Patient has no known allergies.  No family history on file.  Social History Social History   Tobacco Use   Smoking status: Not on file  Substance Use Topics   Alcohol use: Not on file   Drug use: Not on file  No recent EtOH  Review of Systems  Constitutional: No fever/chills Eyes: No visual changes. ENT: No sore throat. Cardiovascular: Denies chest pain. Respiratory: Denies shortness of breath. Gastrointestinal: Positive for abdominal pain.  No nausea, no vomiting.   No diarrhea.  No constipation. Genitourinary: Negative for dysuria. Musculoskeletal: Negative for back pain. Skin: Negative for rash. Neurological: Negative for headaches, focal weakness or numbness.   ____________________________________________   PHYSICAL EXAM:  VITAL SIGNS: ED Triage Vitals  Enc Vitals Group     BP 11/20/18 0451 140/77     Pulse Rate 11/20/18 0451 78     Resp 11/20/18 0451 (!) 22     Temp 11/20/18 0451 97.9 F (36.6 C)     Temp Source 11/20/18 0451 Oral     SpO2 11/20/18 0451 100 %     Weight 11/20/18 0446 195 lb (88.5 kg)     Height 11/20/18 0446  (1.778 m)     Head Circumference --      Peak Flow --      Pain Score 11/20/18 0446 10     Pain Loc --      Pain Edu? --      Excl. in GC? --     Constitutional: Alert and oriented. Well appearing and in moderate acute distress. Eyes: Conjunctivae are normal. PERRL. EOMI. Head: Atraumatic. Nose: No congestion/rhinnorhea. Mouth/Throat: Mucous membranes are moist.  Oropharynx non-erythematous. Neck: No stridor.   Cardiovascular: Normal rate, regular rhythm. Grossly normal heart sounds.  Good peripheral circulation. Respiratory: Normal respiratory effort.  No retractions. Lungs CTAB. Gastrointestinal: Soft and moderately tender to palpation epigastrium and right upper quadrant without rebound or guarding. No distention. No abdominal bruits. No CVA tenderness. Musculoskeletal: No lower extremity tenderness nor edema.  No joint effusions. Neurologic:  Normal speech and language. No gross focal neurologic deficits are appreciated. No gait instability. Skin:  Skin is warm, dry and intact. No rash noted. Psychiatric: Mood and affect are normal. Speech and behavior are normal.  ____________________________________________   LABS (all labs ordered are listed, but only abnormal results are displayed)  Labs Reviewed  URINALYSIS, COMPLETE (UACMP) WITH MICROSCOPIC - Abnormal; Notable for the following  components:      Result Value   Color, Urine YELLOW (*)    APPearance CLOUDY (*)    All other components within normal limits  CBC  COMPREHENSIVE METABOLIC PANEL  LIPASE, BLOOD   ____________________________________________  EKG  None ____________________________________________  RADIOLOGY  ED MD interpretation: Cholelithiasis  Official radiology report(s): [place] Abdomen Limited Ruq  Result Date: 11/20/2018 CLINICAL DATA:  38 year old male with epigastric and right upper quadrant pain. EXAM: ULTRASOUND ABDOMEN LIMITED RIGHT UPPER QUADRANT COMPARISON:  None. FINDINGS: Gallbladder: Numerous shadowing echogenic gallstones. Multiple gallstones in the gallbladder neck (image 20) individually estimated at 13 millimeters diameter (image 4). Superimposed sludge. Gallbladder wall thickness remains normal at 2 millimeters. No sonographic Murphy sign elicited. No pericholecystic fluid. Common bile duct: Diameter: 5 millimeters, normal. Liver: Increased liver echogenicity (image 32). No discrete liver lesion. No intrahepatic biliary ductal dilatation. Portal vein is patent on color Doppler imaging with normal direction of blood flow towards the liver. Other findings: Negative visible right kidney. IMPRESSION: 1. Positive for cholelithiasis with multiple gallstones in the gallbladder neck. No strong evidence of acute cholecystitis at this time. 2. CBD remains within normal limits, no evidence of bile duct obstruction. 3. Evidence of hepatic steatosis. Electronically Signed   By: Odessa FlemingH  Hall M.D.   On: 11/20/2018 06:40    ____________________________________________   PROCEDURES  Procedure(s) performed (including Critical Care):  Procedures  CRITICAL CARE Performed by: Irean HongSUNG,Anneli Bing J   Total critical care time: 30 minutes  Critical care time was exclusive of separately billable procedures and treating other patients.  Critical care was necessary to treat or prevent imminent or life-threatening  deterioration.  Critical care was time spent personally by me on the following activities: development of treatment plan with patient and/or surrogate as well as nursing, discussions with consultants, evaluation of patient's response to treatment, examination of patient, obtaining history from patient or surrogate, ordering and performing treatments and interventions, ordering and review of laboratory studies, ordering and review of radiographic studies, pulse oximetry and re-evaluation of patient's condition. ____________________________________________   INITIAL IMPRESSION / ASSESSMENT AND PLAN / ED COURSE  As part of my medical decision making, I reviewed the following data within the electronic MEDICAL RECORD NUMBER Nursing notes reviewed and incorporated, Labs reviewed, Old chart reviewed, Radiograph reviewed and Notes from prior ED visits        38 year old male who presents with upper abdominal pain; similar episode 2 weeks ago. Differential diagnosis includes, but is not limited to, biliary disease (biliary colic, acute cholecystitis, cholangitis, choledocholithiasis, etc), intrathoracic causes for epigastric abdominal pain including ACS, gastritis, duodenitis, pancreatitis, small bowel or large bowel obstruction, abdominal aortic aneurysm, hernia, and ulcer(s).  Steve Campbell was evaluated in Emergency Department on 11/20/2018 for the symptoms described in the history of present illness. He was evaluated in the context of the global COVID-19 pandemic, which necessitated consideration that the patient might be at risk for infection with the SARS-CoV-2 virus that causes COVID-19. Institutional protocols and algorithms that pertain to the evaluation of patients at risk for COVID-19 are in a state of rapid change  based on information released by regulatory bodies including the CDC and federal and state organizations. These policies and algorithms were followed during the patient's care in the  ED.  Will obtain lab work including LFTs and lipase.  Initiate IV fluid resuscitation, 50 mcg IV fentanyl for pain paired with 4 mg IV Zofran for nausea.  Will proceed with abdominal ultrasound to evaluate for cholecystitis.   Clinical Course as of Nov 20 707  Thu Nov 20, 2018  0704 Resting in no acute distress; pain proved.  Updated patient of ultrasound result.  LFTs and lipase are pending.  At this point care is transferred to Dr. Mayford Knife pending results of lab work.  If normal, I anticipate patient will be discharged home.  Will prepare prescriptions for Percocet and Zofran and surgery follow-up.   [JS]    Clinical Course User Index [JS] Irean Hong, MD     ____________________________________________   FINAL CLINICAL IMPRESSION(S) / ED DIAGNOSES  Final diagnoses:  Pain of upper abdomen  Calculus of gallbladder without cholecystitis without obstruction  Biliary colic     ED Discharge Orders         Ordered    oxyCODONE-acetaminophen (PERCOCET/ROXICET) 5-325 MG tablet  Every 4 hours PRN     11/20/18 0708    ondansetron (ZOFRAN ODT) 4 MG disintegrating tablet  Every 8 hours PRN     11/20/18 0708           Note:  This document was prepared using Dragon voice recognition software and may include unintentional dictation errors.   Irean Hong, MD 11/20/18 [phone]

## 2018-11-20 NOTE — ED Notes (Signed)
Pt to ultrasound

## 2018-11-20 NOTE — Transfer of Care (Signed)
Immediate Anesthesia Transfer of Care Note  Patient: Steve Campbell  Procedure(s) Performed: ENDOSCOPIC RETROGRADE CHOLANGIOPANCREATOGRAPHY (ERCP) (N/A )  Patient Location: PACU  Anesthesia Type:General  Level of Consciousness: sedated  Airway & Oxygen Therapy: Patient Spontanous Breathing  Post-op Assessment: Report given to RN and Post -op Vital signs reviewed and stable  Post vital signs: Reviewed and stable  Last Vitals:  Vitals Value Taken Time  BP 124/75 11/20/2018  3:59 PM  Temp 36.2 C 11/20/2018  3:59 PM  Pulse 81 11/20/2018  4:00 PM  Resp 15 11/20/2018  4:00 PM  SpO2 97 % 11/20/2018  4:00 PM  Vitals shown include unvalidated device data.  Last Pain:  Vitals:   11/20/18 1559  TempSrc: Tympanic  PainSc: Asleep         Complications: No apparent anesthesia complications

## 2018-11-21 ENCOUNTER — Encounter
Admission: EM | Disposition: A | Discharge status: Home / Self Care | Payer: Self-pay | Source: Home / Self Care | Attending: Emergency Medicine

## 2018-11-21 ENCOUNTER — Observation Stay: Payer: No Typology Code available for payment source | Admitting: Anesthesiology

## 2018-11-21 ENCOUNTER — Encounter: Payer: Self-pay | Admitting: *Deleted

## 2018-11-21 HISTORY — PX: CHOLECYSTECTOMY: SHX55

## 2018-11-21 LAB — HEPATIC FUNCTION PANEL
ALT: 431 U/L — ABNORMAL HIGH (ref 0–44)
AST: 226 U/L — ABNORMAL HIGH (ref 15–41)
Albumin: 3.7 g/dL (ref 3.5–5.0)
Alkaline Phosphatase: 78 U/L (ref 38–126)
Bilirubin, Direct: 0.5 mg/dL — ABNORMAL HIGH (ref 0.0–0.2)
Indirect Bilirubin: 1.2 mg/dL — ABNORMAL HIGH (ref 0.3–0.9)
Total Bilirubin: 1.7 mg/dL — ABNORMAL HIGH (ref 0.3–1.2)
Total Protein: 6.1 g/dL — ABNORMAL LOW (ref 6.5–8.1)

## 2018-11-21 LAB — CBC
HCT: 37.3 % — ABNORMAL LOW (ref 39.0–52.0)
Hemoglobin: 12.1 g/dL — ABNORMAL LOW (ref 13.0–17.0)
MCH: 29.1 pg (ref 26.0–34.0)
MCHC: 32.4 g/dL (ref 30.0–36.0)
MCV: 89.7 fL (ref 80.0–100.0)
Platelets: 230 10*3/uL (ref 150–400)
RBC: 4.16 MIL/uL — ABNORMAL LOW (ref 4.22–5.81)
RDW: 12.6 % (ref 11.5–15.5)
WBC: 5 10*3/uL (ref 4.0–10.5)
nRBC: 0 % (ref 0.0–0.2)

## 2018-11-21 LAB — BASIC METABOLIC PANEL
Anion gap: 5 (ref 5–15)
BUN: 10 mg/dL (ref 6–20)
CO2: 26 mmol/L (ref 22–32)
Calcium: 8.6 mg/dL — ABNORMAL LOW (ref 8.9–10.3)
Chloride: 111 mmol/L (ref 98–111)
Creatinine, Ser: 0.84 mg/dL (ref 0.61–1.24)
GFR calc Af Amer: >60 mL/min (ref >60)
GFR calc non Af Amer: >60 mL/min (ref >60)
Glucose, Bld: 99 mg/dL (ref 70–99)
Potassium: 3.8 mmol/L (ref 3.5–5.1)
Sodium: 142 mmol/L (ref 135–145)

## 2018-11-21 LAB — PHOSPHORUS: Phosphorus: 2.2 mg/dL — ABNORMAL LOW (ref 2.5–4.6)

## 2018-11-21 LAB — MAGNESIUM: Magnesium: 2 mg/dL (ref 1.7–2.4)

## 2018-11-21 SURGERY — LAPAROSCOPIC CHOLECYSTECTOMY
Anesthesia: General | Site: Abdomen

## 2018-11-21 MED ORDER — OXYCODONE HCL 5 MG/5ML PO SOLN: 5.0000 mg | Freq: Once | ORAL | Status: DC | PRN

## 2018-11-21 MED ORDER — HYDROCODONE-ACETAMINOPHEN 5-325 MG PO TABS
1.0000 | ORAL_TABLET | ORAL | Status: DC | PRN
  Administered 2018-11-21 (×2): 1 via ORAL
  Administered 2018-11-22: 2 via ORAL
  Administered 2018-11-22: 1 via ORAL
  Filled 2018-11-21: qty 1
  Filled 2018-11-21: qty 2
  Filled 2018-11-21 (×3): qty 1

## 2018-11-21 MED ORDER — ACETAMINOPHEN 10 MG/ML IV SOLN
INTRAVENOUS | Status: AC
  Filled 2018-11-21: qty 100

## 2018-11-21 MED ORDER — DEXAMETHASONE SODIUM PHOSPHATE 10 MG/ML IJ SOLN
INTRAMUSCULAR | Status: DC | PRN
  Administered 2018-11-21: 10 mg via INTRAVENOUS

## 2018-11-21 MED ORDER — PROMETHAZINE HCL 25 MG/ML IJ SOLN: 6.2500 mg | INTRAMUSCULAR | Status: DC | PRN

## 2018-11-21 MED ORDER — ACETAMINOPHEN 650 MG RE SUPP: 650.0000 mg | Freq: Four times a day (QID) | RECTAL | Status: DC | PRN

## 2018-11-21 MED ORDER — FENTANYL CITRATE (PF) 100 MCG/2ML IJ SOLN
INTRAMUSCULAR | Status: DC | PRN
  Administered 2018-11-21 (×3): 50 ug via INTRAVENOUS

## 2018-11-21 MED ORDER — FENTANYL CITRATE (PF) 100 MCG/2ML IJ SOLN
25.0000 ug | INTRAMUSCULAR | Status: DC | PRN
  Administered 2018-11-21 (×5): 25 ug via INTRAVENOUS

## 2018-11-21 MED ORDER — ACETAMINOPHEN 325 MG PO TABS: 650.0000 mg | ORAL_TABLET | Freq: Four times a day (QID) | ORAL | Status: DC | PRN

## 2018-11-21 MED ORDER — LIDOCAINE HCL (PF) 2 % IJ SOLN
INTRAMUSCULAR | Status: AC
  Filled 2018-11-21: qty 10

## 2018-11-21 MED ORDER — SODIUM CHLORIDE 0.9 % IV SOLN
INTRAVENOUS | Status: DC
  Administered 2018-11-21: 07:00:00 via INTRAVENOUS

## 2018-11-21 MED ORDER — ONDANSETRON HCL 4 MG/2ML IJ SOLN
INTRAMUSCULAR | Status: AC
  Filled 2018-11-21: qty 2

## 2018-11-21 MED ORDER — OXYCODONE HCL 5 MG PO TABS: 5.0000 mg | ORAL_TABLET | Freq: Once | ORAL | Status: DC | PRN

## 2018-11-21 MED ORDER — FENTANYL CITRATE (PF) 100 MCG/2ML IJ SOLN
INTRAMUSCULAR | Status: AC
  Filled 2018-11-21: qty 2

## 2018-11-21 MED ORDER — SUGAMMADEX SODIUM 200 MG/2ML IV SOLN
INTRAVENOUS | Status: AC
  Filled 2018-11-21: qty 2

## 2018-11-21 MED ORDER — GLYCOPYRROLATE 0.2 MG/ML IJ SOLN
INTRAMUSCULAR | Status: AC
  Filled 2018-11-21: qty 1

## 2018-11-21 MED ORDER — ROCURONIUM BROMIDE 100 MG/10ML IV SOLN
INTRAVENOUS | Status: DC | PRN
  Administered 2018-11-21: 10 mg via INTRAVENOUS
  Administered 2018-11-21: 50 mg via INTRAVENOUS
  Administered 2018-11-21: 20 mg via INTRAVENOUS

## 2018-11-21 MED ORDER — LIDOCAINE HCL (CARDIAC) PF 100 MG/5ML IV SOSY
PREFILLED_SYRINGE | INTRAVENOUS | Status: DC | PRN
  Administered 2018-11-21: 100 mg via INTRAVENOUS

## 2018-11-21 MED ORDER — SUGAMMADEX SODIUM 200 MG/2ML IV SOLN
INTRAVENOUS | Status: DC | PRN
  Administered 2018-11-21: 200 mg via INTRAVENOUS

## 2018-11-21 MED ORDER — DEXMEDETOMIDINE HCL IN NACL 200 MCG/50ML IV SOLN
INTRAVENOUS | Status: DC | PRN
  Administered 2018-11-21: 20 ug via INTRAVENOUS
  Administered 2018-11-21: 8 ug via INTRAVENOUS

## 2018-11-21 MED ORDER — SUCCINYLCHOLINE CHLORIDE 20 MG/ML IJ SOLN
INTRAMUSCULAR | Status: AC
  Filled 2018-11-21: qty 1

## 2018-11-21 MED ORDER — FENTANYL CITRATE (PF) 100 MCG/2ML IJ SOLN
INTRAMUSCULAR | Status: AC
  Administered 2018-11-21: 25 ug via INTRAVENOUS
  Filled 2018-11-21: qty 2

## 2018-11-21 MED ORDER — ACETAMINOPHEN 10 MG/ML IV SOLN
INTRAVENOUS | Status: DC | PRN
  Administered 2018-11-21: 1000 mg via INTRAVENOUS

## 2018-11-21 MED ORDER — ENOXAPARIN SODIUM 40 MG/0.4ML ~~LOC~~ SOLN: 40.0000 mg | SUBCUTANEOUS | Status: DC

## 2018-11-21 MED ORDER — PROPOFOL 10 MG/ML IV BOLUS
INTRAVENOUS | Status: AC
  Filled 2018-11-21: qty 20

## 2018-11-21 MED ORDER — PHENYLEPHRINE HCL (PRESSORS) 10 MG/ML IV SOLN
INTRAVENOUS | Status: AC
  Filled 2018-11-21: qty 1

## 2018-11-21 MED ORDER — PROPOFOL 10 MG/ML IV BOLUS
INTRAVENOUS | Status: DC | PRN
  Administered 2018-11-21: 40 mg via INTRAVENOUS
  Administered 2018-11-21: 160 mg via INTRAVENOUS

## 2018-11-21 MED ORDER — SEVOFLURANE IN SOLN
RESPIRATORY_TRACT | Status: AC
  Filled 2018-11-21: qty 250

## 2018-11-21 MED ORDER — EPHEDRINE SULFATE 50 MG/ML IJ SOLN
INTRAMUSCULAR | Status: AC
  Filled 2018-11-21: qty 1

## 2018-11-21 MED ORDER — IBUPROFEN 400 MG PO TABS: 600.0000 mg | ORAL_TABLET | Freq: Four times a day (QID) | ORAL | Status: DC | PRN

## 2018-11-21 MED ORDER — TRAMADOL HCL 50 MG PO TABS: 50.0000 mg | ORAL_TABLET | Freq: Four times a day (QID) | ORAL | Status: DC | PRN

## 2018-11-21 MED ORDER — MIDAZOLAM HCL 2 MG/2ML IJ SOLN
INTRAMUSCULAR | Status: DC | PRN
  Administered 2018-11-21: 2 mg via INTRAVENOUS

## 2018-11-21 MED ORDER — LIDOCAINE-EPINEPHRINE (PF) 1 %-1:200000 IJ SOLN
INTRAMUSCULAR | Status: DC | PRN
  Administered 2018-11-21: 10 mL via INTRAMUSCULAR
  Administered 2018-11-21: 7 mL via INTRAMUSCULAR

## 2018-11-21 MED ORDER — DEXMEDETOMIDINE HCL IN NACL 200 MCG/50ML IV SOLN
INTRAVENOUS | Status: AC
  Filled 2018-11-21: qty 50

## 2018-11-21 MED ORDER — FAMOTIDINE 20 MG PO TABS
20.0000 mg | ORAL_TABLET | Freq: Once | ORAL | Status: AC
  Administered 2018-11-21: 07:00:00 20 mg via ORAL

## 2018-11-21 MED ORDER — MIDAZOLAM HCL 2 MG/2ML IJ SOLN
INTRAMUSCULAR | Status: AC
  Filled 2018-11-21: qty 2

## 2018-11-21 MED ORDER — MEPERIDINE HCL 50 MG/ML IJ SOLN: 6.2500 mg | INTRAMUSCULAR | Status: DC | PRN

## 2018-11-21 MED ORDER — ROCURONIUM BROMIDE 50 MG/5ML IV SOLN
INTRAVENOUS | Status: AC
  Filled 2018-11-21: qty 1

## 2018-11-21 MED ORDER — DEXAMETHASONE SODIUM PHOSPHATE 10 MG/ML IJ SOLN
INTRAMUSCULAR | Status: AC
  Filled 2018-11-21: qty 1

## 2018-11-21 SURGICAL SUPPLY — 66 items
ANCHOR TIS RET SYS 235ML (MISCELLANEOUS) ×3 IMPLANT
APPLICATOR ARISTA FLEXITIP XL (MISCELLANEOUS) ×2 IMPLANT
APPLIER CLIP 5 13 M/L LIGAMAX5 (MISCELLANEOUS) ×3
BLADE CLIPPER SURG (BLADE) ×2 IMPLANT
BLADE SURG SZ11 CARB STEEL (BLADE) ×5 IMPLANT
BULB RESERV EVAC DRAIN JP 100C (MISCELLANEOUS) ×2 IMPLANT
CANISTER SUCT 1200ML W/VALVE (MISCELLANEOUS) ×3 IMPLANT
CHLORAPREP W/TINT 26 (MISCELLANEOUS) ×3 IMPLANT
CHOLANGIOGRAM CATH TAUT (CATHETERS) IMPLANT
CLIP APPLIE 5 13 M/L LIGAMAX5 (MISCELLANEOUS) ×1 IMPLANT
COVER WAND RF STERILE (DRAPES) ×3 IMPLANT
DECANTER SPIKE VIAL GLASS SM (MISCELLANEOUS) ×2 IMPLANT
DEFOGGER SCOPE WARMER CLEARIFY (MISCELLANEOUS) IMPLANT
DERMABOND ADVANCED (GAUZE/BANDAGES/DRESSINGS) ×2
DERMABOND ADVANCED .7 DNX12 (GAUZE/BANDAGES/DRESSINGS) ×1 IMPLANT
DISSECTOR BLUNT TIP ENDO 5MM (MISCELLANEOUS) ×2 IMPLANT
DISSECTOR KITTNER STICK (MISCELLANEOUS) IMPLANT
DISSECTORS/KITTNER STICK (MISCELLANEOUS)
DRAIN CHANNEL JP 15F RND 16 (MISCELLANEOUS) ×2 IMPLANT
DRAPE C-ARM XRAY 36X54 (DRAPES) IMPLANT
DRAPE SHEET LG 3/4 BI-LAMINATE (DRAPES) ×2 IMPLANT
DRSG TEGADERM 4X4.75 (GAUZE/BANDAGES/DRESSINGS) IMPLANT
ELECT CAUTERY BLADE 6.4 (BLADE) ×2 IMPLANT
ELECT REM PT RETURN 9FT ADLT (ELECTROSURGICAL) ×3
ELECTRODE REM PT RTRN 9FT ADLT (ELECTROSURGICAL) ×1 IMPLANT
GLOVE BIOGEL PI IND STRL 7.0 (GLOVE) ×1 IMPLANT
GLOVE BIOGEL PI INDICATOR 7.0 (GLOVE) ×6
GLOVE SURG SYN 6.5 ES PF (GLOVE) ×9 IMPLANT
GLOVE SURG SYN 6.5 PF PI (GLOVE) ×1 IMPLANT
GOWN STRL REUS W/ TWL LRG LVL3 (GOWN DISPOSABLE) ×3 IMPLANT
GOWN STRL REUS W/TWL LRG LVL3 (GOWN DISPOSABLE) ×6
GRASPER SUT TROCAR 14GX15 (MISCELLANEOUS) ×3 IMPLANT
HEMOSTAT ARISTA ABSORB 1G (HEMOSTASIS) ×2 IMPLANT
HEMOSTAT SURGICEL 2X3 (HEMOSTASIS) ×2 IMPLANT
IRRIGATION STRYKERFLOW (MISCELLANEOUS) IMPLANT
IRRIGATOR STRYKERFLOW (MISCELLANEOUS) ×3
IV CATH ANGIO 12GX3 LT BLUE (NEEDLE) IMPLANT
IV NS 1000ML (IV SOLUTION) ×2
IV NS 1000ML BAXH (IV SOLUTION) IMPLANT
JACKSON PRATT 10 (INSTRUMENTS) IMPLANT
L-HOOK LAP DISP 36CM (ELECTROSURGICAL) ×3
LHOOK LAP DISP 36CM (ELECTROSURGICAL) ×1 IMPLANT
NEEDLE HYPO 22GX1.5 SAFETY (NEEDLE) ×3 IMPLANT
NEEDLE VERESS 14GA 120MM (NEEDLE) ×2 IMPLANT
PACK LAP CHOLECYSTECTOMY (MISCELLANEOUS) ×3 IMPLANT
PENCIL ELECTRO HAND CTR (MISCELLANEOUS) ×3 IMPLANT
PORT ACCESS TROCAR AIRSEAL 5 (TROCAR) ×1 IMPLANT
SCISSORS METZENBAUM CVD 33 (INSTRUMENTS) ×3 IMPLANT
SET TRI-LUMEN FLTR TB AIRSEAL (TUBING) ×1 IMPLANT
SLEEVE ENDOPATH XCEL 5M (ENDOMECHANICALS) ×5 IMPLANT
SPONGE DRAIN TRACH 4X4 STRL 2S (GAUZE/BANDAGES/DRESSINGS) ×2 IMPLANT
SPONGE LAP 18X18 RF (DISPOSABLE) IMPLANT
STOPCOCK 4 WAY LG BORE MALE ST (IV SETS) IMPLANT
SUT MNCRL 4-0 (SUTURE) ×2
SUT MNCRL 4-0 27XMFL (SUTURE) ×1
SUT SILK 4 0 (SUTURE) ×2
SUT SILK 4-0 18XBRD TIE 12 (SUTURE) IMPLANT
SUT VIC AB 3-0 SH 27 (SUTURE)
SUT VIC AB 3-0 SH 27X BRD (SUTURE) IMPLANT
SUT VICRYL 0 AB UR-6 (SUTURE) ×8 IMPLANT
SUTURE MNCRL 4-0 27XMF (SUTURE) ×1 IMPLANT
SYR 20CC LL (SYRINGE) ×3 IMPLANT
TOWEL OR 17X26 4PK STRL BLUE (TOWEL DISPOSABLE) ×3 IMPLANT
TROCAR XCEL BLUNT TIP 100MML (ENDOMECHANICALS) ×3 IMPLANT
TROCAR XCEL NON-BLD 5MMX100MML (ENDOMECHANICALS) ×3 IMPLANT
WATER STERILE IRR 1000ML POUR (IV SOLUTION) ×3 IMPLANT

## 2018-11-21 NOTE — Anesthesia Preprocedure Evaluation (Signed)
Anesthesia Evaluation  Patient identified by MRN, date of birth, ID band Patient awake    Reviewed: Allergy & Precautions, NPO status , Patient's Chart, lab work & pertinent test results  History of Anesthesia Complications Negative for: history of anesthetic complications  Airway Mallampati: II  TM Distance: >3 FB Neck ROM: Full    Dental no notable dental hx.    Pulmonary neg pulmonary ROS, neg sleep apnea, neg COPD,    breath sounds clear to auscultation- rhonchi (-) wheezing      Cardiovascular Exercise Tolerance: Good (-) hypertension(-) CAD, (-) Past MI, (-) Cardiac Stents and (-) CABG  Rhythm:Regular Rate:Normal - Systolic murmurs and - Diastolic murmurs    Neuro/Psych neg Seizures negative neurological ROS  negative psych ROS   GI/Hepatic negative GI ROS, Neg liver ROS,   Endo/Other  negative endocrine ROSneg diabetes  Renal/GU negative Renal ROS     Musculoskeletal negative musculoskeletal ROS (+)   Abdominal (+) - obese,   Peds  Hematology negative hematology ROS (+)   Anesthesia Other Findings    Reproductive/Obstetrics                             Anesthesia Physical Anesthesia Plan  ASA: I  Anesthesia Plan: General   Post-op Pain Management:    Induction: Intravenous  PONV Risk Score and Plan: 1 and Ondansetron and Midazolam  Airway Management Planned: Oral ETT  Additional Equipment:   Intra-op Plan:   Post-operative Plan: Extubation in OR  Informed Consent: I have reviewed the patients History and Physical, chart, labs and discussed the procedure including the risks, benefits and alternatives for the proposed anesthesia with the patient or authorized representative who has indicated his/her understanding and acceptance.     Dental advisory given  Plan Discussed with: CRNA and Anesthesiologist  Anesthesia Plan Comments:         Anesthesia Quick  Evaluation

## 2018-11-21 NOTE — Progress Notes (Signed)
OR nurse called. Report provided. OR nurse request completion of consent. Pt exspresses knowledge and understanding of planned procedure. Attestation and order for consent not in place. OR called @ [phone] with no response.

## 2018-11-21 NOTE — Progress Notes (Signed)
Pt refuses cardiac monitor and educated.

## 2018-11-21 NOTE — Anesthesia Procedure Notes (Signed)
Procedure Name: Intubation Date/Time: 11/21/2018 7:44 AM Performed by: Doreen Salvage, CRNA Pre-anesthesia Checklist: Patient identified, Patient being monitored, Timeout performed, Emergency Drugs available and Suction available Patient Re-evaluated:Patient Re-evaluated prior to induction Oxygen Delivery Method: Circle system utilized Preoxygenation: Pre-oxygenation with 100% oxygen Induction Type: IV induction Ventilation: Mask ventilation without difficulty Laryngoscope Size: Mac and 3 Grade View: Grade II Tube type: Oral Tube size: 7.5 mm Number of attempts: 1 Airway Equipment and Method: Stylet Placement Confirmation: ETT inserted through vocal cords under direct vision,  positive ETCO2 and breath sounds checked- equal and bilateral Secured at: 23 cm Tube secured with: Tape Dental Injury: Teeth and Oropharynx as per pre-operative assessment

## 2018-11-21 NOTE — Plan of Care (Signed)
Pt s/p laparoscopic cholecystectomy. VSS. Pain managed with morphine and norco. Surgical sites WDL. Scant serosanguineous within JP.  Denies n/v. Tolerated clear liquid diet. Ambulated to the bathroom independently. Encouraged to use incentive spirometer.

## 2018-11-21 NOTE — Op Note (Signed)
Preoperative diagnosis:  acute and cholecystitis  Postoperative diagnosis: same as above  Procedure: Laparoscopic Cholecystectomy.   Anesthesia: GETA   Surgeon: Sung Amabile  Specimen: Gallbladder  Complications: None  EBL: 70mL  Wound Classification: Clean Contaminated  Indications: see HPI  Findings: Critical view of safety noted Cystic duct and artery identified, ligated and divided, clips remained intact at end of procedure Adequate hemostasis  Description of procedure: The patient was placed on the operating table in the supine position. SCDs placed, pre-op abx administered.  General anesthesia was induced and OG tube placed by anesthesia. A time-out was completed verifying correct patient, procedure, site, positioning, and implant(s) and/or special equipment prior to beginning this procedure. The abdomen was prepped and draped in the usual sterile fashion.  An incision was made in a natural skin line below the umbilicus.  Dissection carried down to fascia where veress needle placed and insufflation started up to 24mm Hg without any dramatic increase in pressure after two audible clicks and saline drop test done. Needle removed and 67mm optiview used to enter abdomen.  No injuries from initial trocar placement were noted. Additional trocars were then inserted under direct visualization in the following locations: a 12-mm trocar in the subxyphoid region and two 5-mm trocars along the right costal margin. The abdomen was inspected and no abnormalities or injuries were found. The table was placed in the reverse Trendelenburg position with the right side up.  Filmy adhesions between the gallbladder and omentum, duodenum and transverse colon were lysed sharply. The dome of the gallbladder was grasped with an atraumatic grasper passed through the lateral port and retracted over the dome of the liver. The infundibulum was also grasped with an atraumatic grasper and retracted toward the right  lower quadrant. This maneuver exposed Calot's triangle. The peritoneum overlying the gallbladder infundibulum was then dissected and the cystic duct and cystic artery identified.  Critical view of safety with the liver bed clearly visible behind the duct and artery with no additional structures noted.  Picture taken before the cystic duct and cystic artery clipped and divided close to the gallbladder.  The gallbladder was then dissected from its peritoneal and liver bed attachments by electrocautery. Hemostasis was checked and fossa noted to have bleeding along entire bed.  Electrocautery and arista used to control bleeding. The gallbladder fossa was copiously irrigated with saline and any leaked bile was suctioned out, and hemostasis was obtained. There was no evidence of bleeding from the gallbladder fossa or cystic artery or leakage of the bile from the cystic duct stump.   Blake drain placed through the right lateral port and the gallbladder was removed using an endoscopic retrieval bag placed through the xyphoid port. The gallbladder was passed off the table as a specimen.  Abdomen desufflated and secondary trocars were removed under direct vision. No bleeding was noted.  0-vicryl in a running fashion used to close the extended xyphoid port site where the distended gallbladder was removed.  3-0 vicryl used to close deep dermal layer at xyphoid site.  All skin incisions then closed with subcuticular sutures of 4-0 monocryl and dressed with topical skin adhesive. The orogastric tube was removed and patient extubated.  The patient tolerated the procedure well and was taken to the postanesthesia care unit in stable condition.  All sponge and instrument count correct at end of procedure.

## 2018-11-21 NOTE — Anesthesia Post-op Follow-up Note (Signed)
Anesthesia QCDR form completed.        

## 2018-11-21 NOTE — Progress Notes (Signed)
Attestation viewable in summary need order to obtain consent. OR called no response at this time

## 2018-11-21 NOTE — Transfer of Care (Signed)
Immediate Anesthesia Transfer of Care Note  Patient: Steve Campbell  Procedure(s) Performed: Procedure(s): LAPAROSCOPIC CHOLECYSTECTOMY (N/A)  Patient Location: PACU  Anesthesia Type:General  Level of Consciousness: sedated  Airway & Oxygen Therapy: Patient Spontanous Breathing and Patient connected to face mask oxygen  Post-op Assessment: Report given to RN and Post -op Vital signs reviewed and stable  Post vital signs: Reviewed and stable  Last Vitals:  Vitals:   11/21/18 0436 11/21/18 1005  BP: 116/76 (!) 101/50  Pulse: (!) 46 (!) 58  Resp: 15 18  Temp: 36.7 C (!) 36.2 C  SpO2: 95% 100%    Complications: No apparent anesthesia complications

## 2018-11-22 LAB — CBC
HCT: 37.9 % — ABNORMAL LOW (ref 39.0–52.0)
Hemoglobin: 12.2 g/dL — ABNORMAL LOW (ref 13.0–17.0)
MCH: 28.9 pg (ref 26.0–34.0)
MCHC: 32.2 g/dL (ref 30.0–36.0)
MCV: 89.8 fL (ref 80.0–100.0)
Platelets: 254 10*3/uL (ref 150–400)
RBC: 4.22 MIL/uL (ref 4.22–5.81)
RDW: 13 % (ref 11.5–15.5)
WBC: 8.7 10*3/uL (ref 4.0–10.5)
nRBC: 0 % (ref 0.0–0.2)

## 2018-11-22 LAB — PHOSPHORUS: Phosphorus: 3.2 mg/dL (ref 2.5–4.6)

## 2018-11-22 LAB — HIV ANTIBODY (ROUTINE TESTING W REFLEX): HIV Screen 4th Generation wRfx: NONREACTIVE

## 2018-11-22 LAB — BASIC METABOLIC PANEL
Anion gap: 8 (ref 5–15)
BUN: 10 mg/dL (ref 6–20)
CO2: 26 mmol/L (ref 22–32)
Calcium: 8.7 mg/dL — ABNORMAL LOW (ref 8.9–10.3)
Chloride: 107 mmol/L (ref 98–111)
Creatinine, Ser: 0.84 mg/dL (ref 0.61–1.24)
GFR calc Af Amer: >60 mL/min (ref >60)
GFR calc non Af Amer: >60 mL/min (ref >60)
Glucose, Bld: 101 mg/dL — ABNORMAL HIGH (ref 70–99)
Potassium: 3.7 mmol/L (ref 3.5–5.1)
Sodium: 141 mmol/L (ref 135–145)

## 2018-11-22 LAB — HEPATIC FUNCTION PANEL
ALT: 330 U/L — ABNORMAL HIGH (ref 0–44)
AST: 95 U/L — ABNORMAL HIGH (ref 15–41)
Albumin: 4 g/dL (ref 3.5–5.0)
Alkaline Phosphatase: 77 U/L (ref 38–126)
Bilirubin, Direct: 0.2 mg/dL (ref 0.0–0.2)
Indirect Bilirubin: 0.9 mg/dL (ref 0.3–0.9)
Total Bilirubin: 1.1 mg/dL (ref 0.3–1.2)
Total Protein: 6.7 g/dL (ref 6.5–8.1)

## 2018-11-22 LAB — MAGNESIUM: Magnesium: 1.9 mg/dL (ref 1.7–2.4)

## 2018-11-22 MED ORDER — SIMETHICONE 80 MG PO CHEW: 80.0000 mg | CHEWABLE_TABLET | Freq: Four times a day (QID) | ORAL | 0 refills | Status: DC | PRN

## 2018-11-22 MED ORDER — IBUPROFEN 800 MG PO TABS: 800.0000 mg | ORAL_TABLET | Freq: Three times a day (TID) | ORAL | 0 refills | Status: DC | PRN

## 2018-11-22 MED ORDER — DOCUSATE SODIUM 100 MG PO CAPS: 100.0000 mg | ORAL_CAPSULE | Freq: Two times a day (BID) | ORAL | 0 refills | Status: AC | PRN

## 2018-11-22 MED ORDER — GABAPENTIN 100 MG PO CAPS: 100.0000 mg | ORAL_CAPSULE | Freq: Every day | ORAL | 0 refills | Status: DC

## 2018-11-22 MED ORDER — HYDROCODONE-ACETAMINOPHEN 5-325 MG PO TABS: 1.0000 | ORAL_TABLET | Freq: Four times a day (QID) | ORAL | 0 refills | Status: AC | PRN

## 2018-11-22 NOTE — Progress Notes (Addendum)
Telephone [phone]) voicemail message left for Dr Tonna Boehringer regarding location of printed Rx for Zofran ODT; pt's discharge pending location of Rx

## 2018-11-22 NOTE — Progress Notes (Signed)
Pt is being discharged home. AVS given and explained to pt. Pt verbalized understanding.

## 2018-11-22 NOTE — Discharge Summary (Signed)
Physician Discharge Summary  Patient ID: Steve Campbell MRN: [id number] DOB/AGE: 1980/08/16 38 y.o.  Admit date: 11/20/2018 Discharge date: 11/22/2018  Admission Diagnoses: choledocolithiasis  Discharge Diagnoses:  Same as above plus cholecystitis  Discharged Condition: good  Hospital Course: dx with above.  Underwent ERCP, then lap chole.  Blake drain placed for bleeding, but no evidence of active bleeding by time of discharge and blake removed.  Uneventful recovery.   Tolerating diet, passing flatus  Consults: GI  Discharge Exam: Blood pressure 133/83, pulse 70, temperature 97.6 F (36.4 C), temperature source Oral, resp. rate 16, height 5\' 10"  (1.778 m), weight 88.5 kg, SpO2 98 %. General appearance: no distress GI: soft, appropriate tenderness at incisions c/d/i.  JP with serous drainage, removed without any issues.  Disposition:  Discharge disposition: 01-Home or Self Care       Discharge Instructions    Discharge patient   Complete by:  As directed    Discharge disposition:  01-Home or Self Care   Discharge patient date:  11/22/2018     Allergies as of 11/22/2018   No Known Allergies     Medication List    TAKE these medications   docusate sodium 100 MG capsule Commonly known as:  Colace Take 1 capsule (100 mg total) by mouth 2 (two) times daily as needed for up to 10 days for mild constipation.   gabapentin 100 MG capsule Commonly known as:  Neurontin Take 1 capsule (100 mg total) by mouth daily for 10 doses.   HYDROcodone-acetaminophen 5-325 MG tablet Commonly known as:  Norco Take 1 tablet by mouth every 6 (six) hours as needed for up to 3 days for moderate pain.   ibuprofen 800 MG tablet Commonly known as:  ADVIL Take 1 tablet (800 mg total) by mouth every 8 (eight) hours as needed for mild pain or moderate pain.   ondansetron 4 MG disintegrating tablet Commonly known as:  Zofran ODT Take 1 tablet (4 mg total) by mouth every 8 (eight) hours as  needed for nausea or vomiting.   simethicone 80 MG chewable tablet Commonly known as:  Gas-X Chew 1 tablet (80 mg total) by mouth 4 (four) times daily as needed for up to 10 days for flatulence.      Follow-up Information    Sung Amabile, DO Follow up on 11/28/2018.   Specialty:  Surgery Contact information: [address] [place] [address] [phone]            Total time spent arranging discharge was >87min. Signed: Sung Amabile 11/22/2018, 8:26 AM

## 2018-11-22 NOTE — Progress Notes (Signed)
Telephone call from Dr Tonna Boehringer clarified that the Rx for Zofran ODT was from Dr Dolores Frame in the ED on 11/23/2018 prior to admitting the pt to the hospital; the pt does not need the Rx at discharge; will advise the pt for discharge; pt's RN, Malka advised of this conversation

## 2018-11-22 NOTE — Plan of Care (Signed)

## 2018-11-23 ENCOUNTER — Encounter: Payer: Self-pay | Admitting: Surgery

## 2018-11-24 LAB — SURGICAL PATHOLOGY

## 2018-11-24 NOTE — Anesthesia Postprocedure Evaluation (Signed)
Anesthesia Post Note  Patient: Steve Campbell  Procedure(s) Performed: LAPAROSCOPIC CHOLECYSTECTOMY (N/A Abdomen)  Patient location during evaluation: PACU Anesthesia Type: General Level of consciousness: awake and alert and oriented Pain management: pain level controlled Vital Signs Assessment: post-procedure vital signs reviewed and stable Respiratory status: spontaneous breathing, nonlabored ventilation and respiratory function stable Cardiovascular status: blood pressure returned to baseline and stable Postop Assessment: no signs of nausea or vomiting Anesthetic complications: no     Last Vitals:  Vitals:   11/21/18 1931 11/22/18 0925  BP: 133/83 132/71  Pulse: 70 61  Resp: 16 18  Temp: 36.4 C 36.5 C  SpO2: 98% 97%    Last Pain:  Vitals:   11/22/18 1027  TempSrc:   PainSc: 4                  Briyan Kleven

## 2018-11-26 ENCOUNTER — Other Ambulatory Visit: Payer: Self-pay | Admitting: *Deleted

## 2018-11-26 ENCOUNTER — Encounter: Payer: Self-pay | Admitting: *Deleted

## 2018-11-26 NOTE — Patient Outreach (Signed)
[facility]) Care Management  11/26/2018  Steve Campbell 01-Apr-1981 [id number]  Transition of care call   Referral received: Initial outreach: Insurance: [employer]   Subjective: Initial successful telephone call to patient's preferred number in order to complete transition of care assessment; 2 HIPAA identifiers verified. Explained purpose of call and completed transition of care assessment.  Steve Campbell states he is doing well, denies post op problems, states surgical pain well managed with prescribed medications, tolerating  diet, states surgical incisions and previous Blake drain insertion site unremarkable, denies bowel or bladder problems.  Spouse is assisting  with his recovery.  Steve Campbell says he has been through [employer] tobacco cessation program due to his snuff use and it was not successful; says he is not interested in the program at present. Steve Campbell expressed appreciation for the transition of care call.    Objective:  Steve Campbell was hospitalized at [facility] from 4/423-11/22/2018 for abdominal pain related to cholecystitis. He had an ERCP on 4/23 and 3 gallstones were removed, then had laparoscopic cholecystectomy on 11/21/18.   Comorbidities include: snuff user and obesity He was discharged to home on 4//20 without the need for home health services or DME.   Assessment:  Patient voices good understanding of all discharge instructions.  See transition of care flowsheet for assessment details.   Plan:  Reviewed post laparoscopic cholecystectomy treatment plan, medication adherence, surgical pain management and  post cholecystectomy diet. Offered [employer] tobacco cessation program due to Steve Campbell's ongoing snuff use.  No ongoing care management needs identified so will close case to [facility] Care Management care management services and route successful outreach letter with [facility] Care  Management pamphlet and 24 Hour Nurse Line Magnet to [employer] Care Management clinical pool to be mailed to patient's home address.  Steve Richard RN,CCM,CDE [facility] Care Management Coordinator Office Phone [phone] Office Fax [phone]

## 2020-05-12 ENCOUNTER — Encounter: Payer: Self-pay | Admitting: Family Medicine

## 2020-05-12 ENCOUNTER — Ambulatory Visit: Payer: Self-pay | Admitting: Family Medicine

## 2020-05-12 ENCOUNTER — Other Ambulatory Visit: Payer: Self-pay

## 2020-05-12 VITALS — BP 120/68 | HR 62 | Temp 98.6°F | Resp 18 | Ht 70.0 in | Wt 219.2 lb

## 2020-05-12 DIAGNOSIS — Z024 Encounter for examination for driving license: Secondary | ICD-10-CM

## 2020-05-12 NOTE — Assessment & Plan Note (Signed)
DOT Certificate provided x 2 years  Hearing test: Pass at 15' Vision: 20/20 R, 20/20 L, 20/20 Both Uncorrected Urine 1.025, Neg Protein, Neg Glucose, Neg Hematuria  STOPBANG: 2/8  

## 2020-05-12 NOTE — Patient Instructions (Signed)
DOT certificate provided x 2 years 

## 2020-05-12 NOTE — Progress Notes (Signed)
Subjective:    Patient ID: Steve Campbell, male    DOB: 02-03-1981, 39 y.o.   MRN: [id number]  Steve Campbell is a 39 y.o. male presenting on 05/12/2020 for Employment Physical (DOT Physical)   HPI  Mr. Monical presents to clinic for a DOT PE  No flowsheet data found.  Social History   Tobacco Use  . Smoking status: Never Smoker  . Smokeless tobacco: Current User    Types: Snuff  Vaping Use  . Vaping Use: Never used  Substance Use Topics  . Alcohol use: Yes    Comment: occasionally  . Drug use: Never    Review of Systems  Constitutional: Negative.   HENT: Negative.   Eyes: Negative.   Respiratory: Negative.   Cardiovascular: Negative.   Gastrointestinal: Negative.   Endocrine: Negative.   Genitourinary: Negative.   Musculoskeletal: Negative.   Skin: Negative.   Allergic/Immunologic: Negative.   Neurological: Negative.   Hematological: Negative.   Psychiatric/Behavioral: Negative.    Per HPI unless specifically indicated above     Objective:    BP 120/68 (BP Location: Right Arm, Patient Position: Sitting, Cuff Size: Large)   Pulse 62   Temp 98.6 F (37 C) (Oral)   Resp 18   Ht 5\' 10"  (1.778 m)   Wt 219 lb 3.2 oz (99.4 kg)   SpO2 98%   BMI 31.45 kg/m   Wt Readings from Last 3 Encounters:  05/12/20 219 lb 3.2 oz (99.4 kg)  11/20/18 195 lb (88.5 kg)    Physical Exam Vitals reviewed.  Constitutional:      General: He is not in acute distress.    Appearance: Normal appearance. He is well-developed and well-groomed. He is obese. He is not ill-appearing or toxic-appearing.  HENT:     Head: Normocephalic.     Right Ear: Tympanic membrane, ear canal and external ear normal. There is no impacted cerumen.     Left Ear: Tympanic membrane, ear canal and external ear normal. There is no impacted cerumen.     Nose: Nose normal. No congestion or rhinorrhea.     Mouth/Throat:     Mouth: Mucous membranes are moist.     Pharynx: Oropharynx is clear. No  oropharyngeal exudate or posterior oropharyngeal erythema.  Eyes:     General: Lids are normal. Vision grossly intact. No scleral icterus.       Right eye: No discharge.        Left eye: No discharge.     Extraocular Movements: Extraocular movements intact.     Conjunctiva/sclera: Conjunctivae normal.     Pupils: Pupils are equal, round, and reactive to light.  Cardiovascular:     Rate and Rhythm: Normal rate and regular rhythm.     Pulses: Normal pulses.          Dorsalis pedis pulses are 2+ on the right side and 2+ on the left side.     Heart sounds: Normal heart sounds. No murmur heard.  No friction rub. No gallop.   Pulmonary:     Effort: Pulmonary effort is normal. No respiratory distress.     Breath sounds: Normal breath sounds. No wheezing, rhonchi or rales.  Abdominal:     General: Abdomen is flat. Bowel sounds are normal. There is no distension.     Palpations: Abdomen is soft. There is no hepatomegaly, splenomegaly or mass.     Tenderness: There is no abdominal tenderness. There is no right CVA tenderness, left CVA tenderness,  guarding or rebound.     Hernia: No hernia is present.  Musculoskeletal:        General: Normal range of motion.     Cervical back: Normal range of motion and neck supple. No rigidity or tenderness.     Right lower leg: No edema.     Left lower leg: No edema.     Comments: Normal tone, 5/5 strength BUE & BLE  Feet:     Right foot:     Skin integrity: Skin integrity normal.     Left foot:     Skin integrity: Skin integrity normal.  Lymphadenopathy:     Cervical: No cervical adenopathy.  Skin:    General: Skin is warm and dry.     Capillary Refill: Capillary refill takes less than 2 seconds.  Neurological:     General: No focal deficit present.     Mental Status: He is alert and oriented to person, place, and time.     Cranial Nerves: Cranial nerves are intact. No cranial nerve deficit.     Sensory: Sensation is intact. No sensory deficit.      Motor: Motor function is intact. No weakness.     Coordination: Coordination is intact. Coordination normal.     Gait: Gait is intact. Gait normal.     Deep Tendon Reflexes: Reflexes are normal and symmetric. Reflexes normal.  Psychiatric:        Attention and Perception: Attention and perception normal.        Mood and Affect: Mood and affect normal.        Speech: Speech normal.        Behavior: Behavior normal. Behavior is cooperative.        Thought Content: Thought content normal.        Cognition and Memory: Cognition and memory normal.        Judgment: Judgment normal.    Results for orders placed or performed during the hospital encounter of 11/20/18  Surgical PCR screen   Specimen: Nasal Mucosa; Nasal Swab  Result Value Ref Range   MRSA, PCR NEGATIVE NEGATIVE   Staphylococcus aureus POSITIVE (A) NEGATIVE  CBC  Result Value Ref Range   WBC 6.1 4.0 - 10.5 K/uL   RBC 4.92 4.22 - 5.81 MIL/uL   Hemoglobin 14.4 13.0 - 17.0 g/dL   HCT 33.8 39 - 52 %   MCV 88.4 80.0 - 100.0 fL   MCH 29.3 26.0 - 34.0 pg   MCHC 33.1 30.0 - 36.0 g/dL   RDW 25.0 53.9 - 76.7 %   Platelets 277 150 - 400 K/uL   nRBC 0.0 0.0 - 0.2 %  Urinalysis, Complete w Microscopic  Result Value Ref Range   Color, Urine YELLOW (A) YELLOW   APPearance CLOUDY (A) CLEAR   Specific Gravity, Urine 1.021 1.005 - 1.030   pH 7.0 5.0 - 8.0   Glucose, UA NEGATIVE NEGATIVE mg/dL   Hgb urine dipstick NEGATIVE NEGATIVE   Bilirubin Urine NEGATIVE NEGATIVE   Ketones, ur NEGATIVE NEGATIVE mg/dL   Protein, ur NEGATIVE NEGATIVE mg/dL   Nitrite NEGATIVE NEGATIVE   Leukocytes,Ua NEGATIVE NEGATIVE   WBC, UA 0-5 0 - 5 WBC/hpf   Bacteria, UA NONE SEEN NONE SEEN   Squamous Epithelial / LPF NONE SEEN 0 - 5   Mucus PRESENT    Amorphous Crystal PRESENT   Comprehensive metabolic panel  Result Value Ref Range   Sodium 141 135 - 145 mmol/L   Potassium 3.8  3.5 - 5.1 mmol/L   Chloride 107 98 - 111 mmol/L   CO2 26 22 - 32 mmol/L    Glucose, Bld 134 (H) 70 - 99 mg/dL   BUN 19 6 - 20 mg/dL   Creatinine, Ser 1.27 0.61 - 1.24 mg/dL   Calcium 9.0 8.9 - 51.7 mg/dL   Total Protein 7.2 6.5 - 8.1 g/dL   Albumin 4.3 3.5 - 5.0 g/dL   AST 001 (H) 15 - 41 U/L   ALT 288 (H) 0 - 44 U/L   Alkaline Phosphatase 78 38 - 126 U/L   Total Bilirubin 1.7 (H) 0.3 - 1.2 mg/dL   GFR calc non Af Amer >60 >60 mL/min   GFR calc Af Amer >60 >60 mL/min   Anion gap 8 5 - 15  Lipase, blood  Result Value Ref Range   Lipase 41 11 - 51 U/L  HIV antibody (Routine Testing)  Result Value Ref Range   HIV Screen 4th Generation wRfx Non Reactive Non Reactive  Basic metabolic panel  Result Value Ref Range   Sodium 142 135 - 145 mmol/L   Potassium 3.8 3.5 - 5.1 mmol/L   Chloride 111 98 - 111 mmol/L   CO2 26 22 - 32 mmol/L   Glucose, Bld 99 70 - 99 mg/dL   BUN 10 6 - 20 mg/dL   Creatinine, Ser 7.49 0.61 - 1.24 mg/dL   Calcium 8.6 (L) 8.9 - 10.3 mg/dL   GFR calc non Af Amer >60 >60 mL/min   GFR calc Af Amer >60 >60 mL/min   Anion gap 5 5 - 15  Magnesium  Result Value Ref Range   Magnesium 2.0 1.7 - 2.4 mg/dL  Phosphorus  Result Value Ref Range   Phosphorus 2.2 (L) 2.5 - 4.6 mg/dL  CBC  Result Value Ref Range   WBC 5.0 4.0 - 10.5 K/uL   RBC 4.16 (L) 4.22 - 5.81 MIL/uL   Hemoglobin 12.1 (L) 13.0 - 17.0 g/dL   HCT 44.9 (L) 39 - 52 %   MCV 89.7 80.0 - 100.0 fL   MCH 29.1 26.0 - 34.0 pg   MCHC 32.4 30.0 - 36.0 g/dL   RDW 67.5 91.6 - 38.4 %   Platelets 230 150 - 400 K/uL   nRBC 0.0 0.0 - 0.2 %  Hepatic function panel  Result Value Ref Range   Total Protein 6.1 (L) 6.5 - 8.1 g/dL   Albumin 3.7 3.5 - 5.0 g/dL   AST 665 (H) 15 - 41 U/L   ALT 431 (H) 0 - 44 U/L   Alkaline Phosphatase 78 38 - 126 U/L   Total Bilirubin 1.7 (H) 0.3 - 1.2 mg/dL   Bilirubin, Direct 0.5 (H) 0.0 - 0.2 mg/dL   Indirect Bilirubin 1.2 (H) 0.3 - 0.9 mg/dL  Basic metabolic panel  Result Value Ref Range   Sodium 141 135 - 145 mmol/L   Potassium 3.7 3.5 - 5.1 mmol/L     Chloride 107 98 - 111 mmol/L   CO2 26 22 - 32 mmol/L   Glucose, Bld 101 (H) 70 - 99 mg/dL   BUN 10 6 - 20 mg/dL   Creatinine, Ser 9.93 0.61 - 1.24 mg/dL   Calcium 8.7 (L) 8.9 - 10.3 mg/dL   GFR calc non Af Amer >60 >60 mL/min   GFR calc Af Amer >60 >60 mL/min   Anion gap 8 5 - 15  Magnesium  Result Value Ref Range   Magnesium 1.9 1.7 -  2.4 mg/dL  Phosphorus  Result Value Ref Range   Phosphorus 3.2 2.5 - 4.6 mg/dL  CBC  Result Value Ref Range   WBC 8.7 4.0 - 10.5 K/uL   RBC 4.22 4.22 - 5.81 MIL/uL   Hemoglobin 12.2 (L) 13.0 - 17.0 g/dL   HCT 16.137.9 (L) 39 - 52 %   MCV 89.8 80.0 - 100.0 fL   MCH 28.9 26.0 - 34.0 pg   MCHC 32.2 30.0 - 36.0 g/dL   RDW 09.613.0 04.511.5 - 40.915.5 %   Platelets 254 150 - 400 K/uL   nRBC 0.0 0.0 - 0.2 %  Hepatic function panel  Result Value Ref Range   Total Protein 6.7 6.5 - 8.1 g/dL   Albumin 4.0 3.5 - 5.0 g/dL   AST 95 (H) 15 - 41 U/L   ALT 330 (H) 0 - 44 U/L   Alkaline Phosphatase 77 38 - 126 U/L   Total Bilirubin 1.1 0.3 - 1.2 mg/dL   Bilirubin, Direct 0.2 0.0 - 0.2 mg/dL   Indirect Bilirubin 0.9 0.3 - 0.9 mg/dL  Surgical pathology  Result Value Ref Range   SURGICAL PATHOLOGY      Surgical Pathology CASE: [id number] PATIENT: Melina FiddlerBRAD Grajales Surgical Pathology Report     SPECIMEN SUBMITTED: A. Gallbladder  CLINICAL HISTORY: None provided  PRE-OPERATIVE DIAGNOSIS: Cholecystitis  POST-OPERATIVE DIAGNOSIS: Same as pre-op     DIAGNOSIS: A.  GALLBLADDER; CHOLECYSTECTOMY: - CHRONIC CHOLECYSTITIS WITH CHOLELITHIASIS. - NEGATIVE FOR HIGH-GRADE DYSPLASIA AND MALIGNANCY.  GROSS DESCRIPTION: A. Labeled: Gallbladder Received: Formalin Size of specimen: 9.7 x 3.7 x 2.5 cm Specimen integrity: Intact External surface: Tan-pink, smooth, and finely vascular Wall thickness: 0.2 cm (uniform) Mucosa: Tan-green and velvety.  No polyps or abnormalities are grossly identified. Cystic duct: 0.2 cm in length by 0.3 cm in diameter.  Patent.  No  cystic duct lymph node is grossly identified. Bile present: Yes; green bile is present in the lumen. Stones present: Yes; multiple tan-yellow, multifaceted stones (10.0 x 6.5 x 0.4 cm in aggregate) are presen t in the lumen. Other findings: None grossly identified  Block summary: 1 - cystic duct resection margin (inked blue, en face) and gallbladder wall   Final Diagnosis performed by Elijah Birkara Rubinas, MD.   Electronically signed 11/24/2018 12:33:20PM The electronic signature indicates that the named Attending Pathologist has evaluated the specimen  Technical component performed at [facility], [address], [place], [place] [address] Lab: [phone] Dir: Jolene SchimkeSanjai Nagendra, MD, MMM  Professional component performed at [facility], [facility], [address], [place], [place] [address] Lab: [phone] Dir: Georgiann Cockerara C. Rubinas, MD       Assessment & Plan:   Problem List Items Addressed This Visit      Other   Encounter for Department of Transportation (DOT) examination for trucking license - Primary    DOT Certificate provided x 2 years  Hearing test: Pass at 15' Vision: 20/20 R, 20/20 L, 20/20 Both Uncorrected Urine 1.025, Neg Protein, Neg Glucose, Neg Hematuria  STOPBANG: 2/8          No orders of the defined types were placed in this encounter.  Follow up plan: Return in about 2 years (around 05/12/2022) for DOT PE.   Charlaine DaltonNicole Marie Solana Coggin, FNP Family Nurse Practitioner [facility] 05/12/2020, 11:36 AM

## 2020-12-26 IMAGING — MR MR ABDOMEN WO/W CM MRCP
21 of 23 series · 44 of 48 positions shown · IV contrast (Gadavist)
Comparison: None.

CLINICAL DATA: Right upper quadrant pain. Cholelithiasis. Elevated
liver function tests.

EXAM:
MRI ABDOMEN WITHOUT AND WITH CONTRAST (INCLUDING MRCP)
TECHNIQUE: Multiplanar multisequence MR imaging of the abdomen was performed
both before and after the administration of intravenous contrast.
Heavily T2-weighted images of the biliary and pancreatic ducts were
obtained, and three-dimensional MRCP images were rendered by post
processing.
CONTRAST:  8 mL Gadavist

[Series 3: bSSFP · coronal · 6.0mm · 0.74mm/px · 2 of 32 slices shown]
[im 1/32]
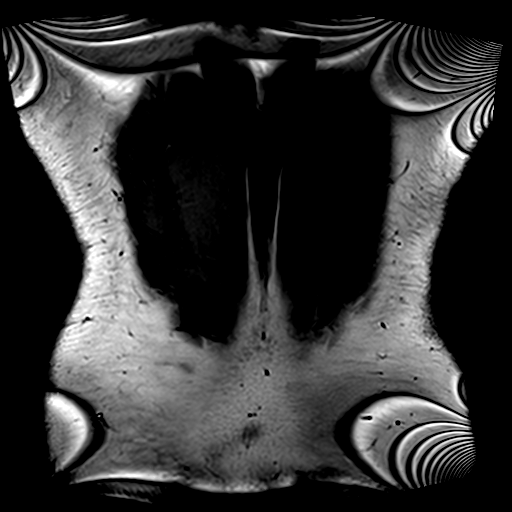
[im 32/32]
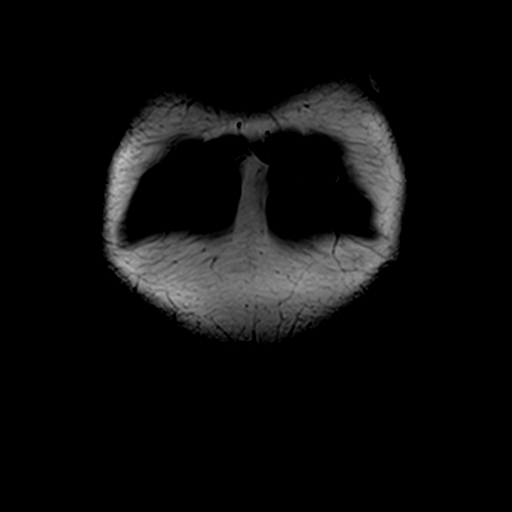

[Series 4: T2 · axial · 6.0mm · 1.19mm/px · z∈[-177,+46]mm · 2 of 32 slices shown]
[im 1/32]
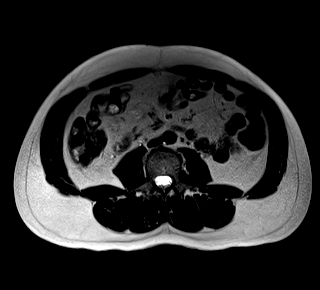
[im 32/32]
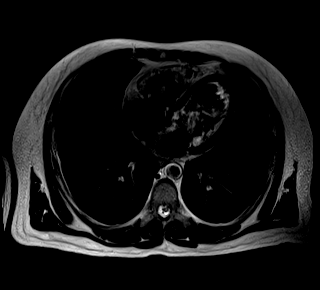

[Series 5: T1 · axial · 6.0mm · 0.74mm/px · z∈[-177,+46]mm · 2 of 32 slices shown (1 of 2)]
[im 1/32]
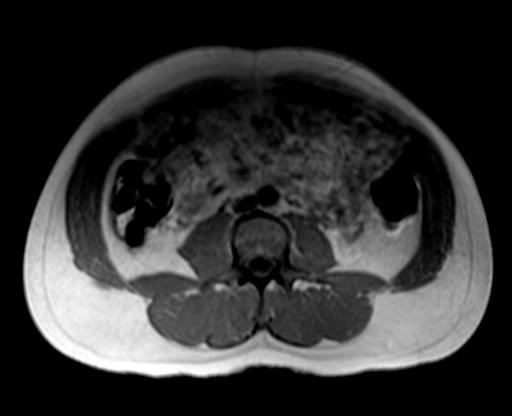
[im 32/32]
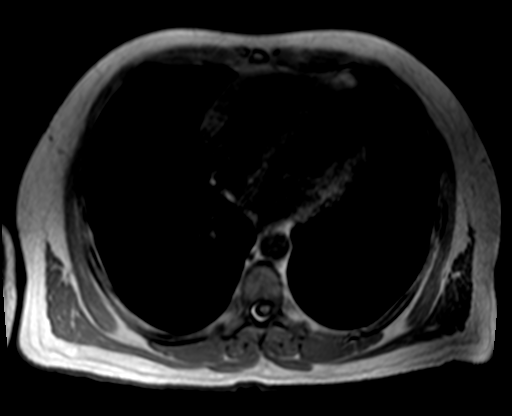

[Series 5: T1 · axial · 6.0mm · 0.74mm/px · 1 of 32 slices shown (2 of 2)]
[im 1/32]
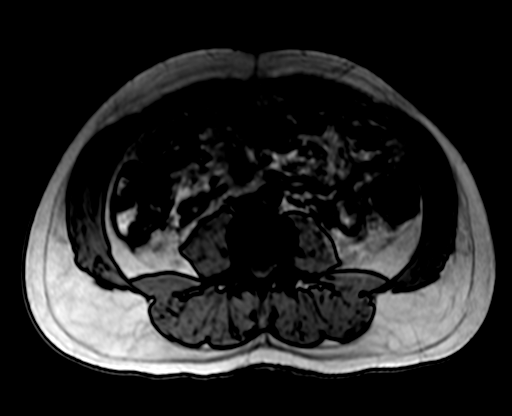

[Series 8: T2 fat-sat · axial · 6.0mm · 1.19mm/px · 1 of 32 slices shown]
[im 1/32]
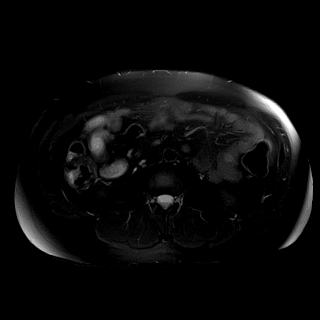

[Series 9: ax dwi_tracew · axial · 6.0mm · 1.42mm/px · 1 of 32 slices shown (1 of 3)]
[im 1/32]
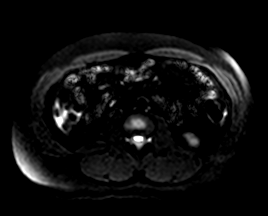

[Series 9: ax dwi_tracew · axial · 6.0mm · 1.42mm/px · 1 of 32 slices shown (2 of 3)]
[im 1/32]
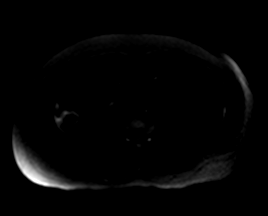

[Series 9: ax dwi_tracew · axial · 6.0mm · 1.42mm/px · 1 of 32 slices shown (3 of 3)]
[im 1/32]
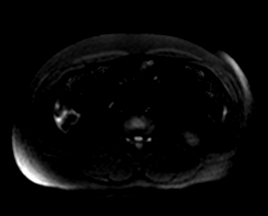

[Series 10: ax dwi_adc · axial · 6.0mm · 1.42mm/px · 1 of 32 slices shown]
[im 1/32]
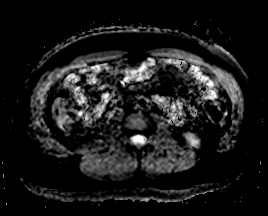

[Series 14: MRCP · coronal · 3.0mm · 1.12mm/px · 1 of 17 slices shown]
[im 1/17]
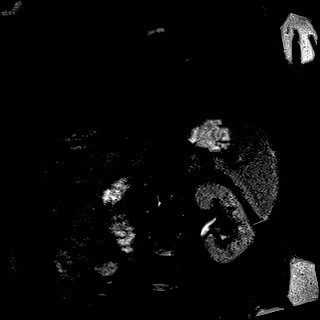

[Series 15: radials · coronal · 50.0mm · 0.78mm/px · 1 of 5 slices shown]
[im 1/5]
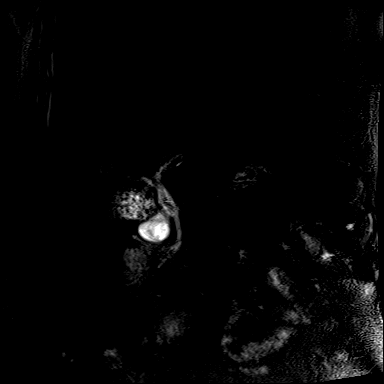

[Series 17: T1 dynamic fat-sat · axial · non-contrast · 3.0mm · 1.19mm/px · z∈[-183,+54]mm · 3 of 80 slices shown (1 of 5)]
[im 1/80]
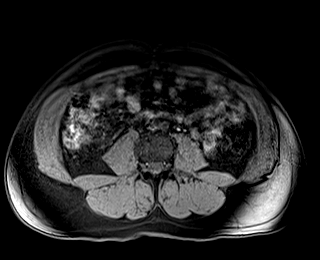
[im 40/80]
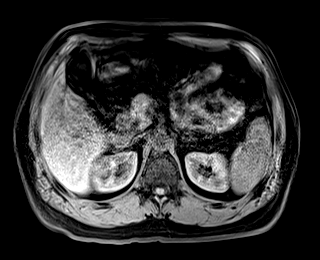
[im 80/80]
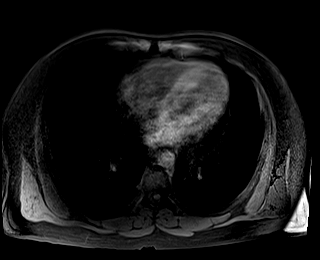

[Series 18: T1 dynamic fat-sat post-contrast · axial · 3.0mm · 1.19mm/px · z∈[-183,+54]mm · 3 of 80 slices shown (1 of 4)]
[im 1/80]
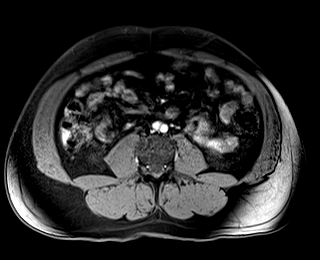
[im 40/80]
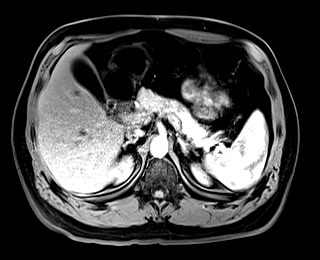
[im 80/80]
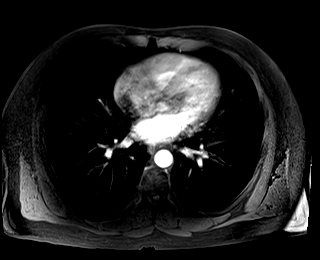

[Series 19: T1 dynamic fat-sat · axial · 3.0mm · 1.19mm/px · z∈[-183,+54]mm · 3 of 80 slices shown (2 of 5)]
[im 1/80]
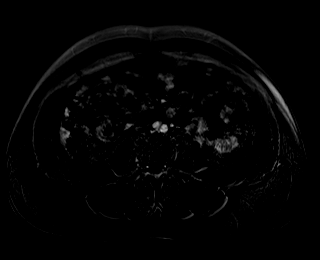
[im 40/80]
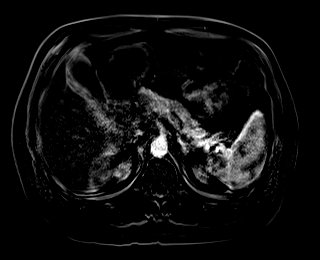
[im 80/80]
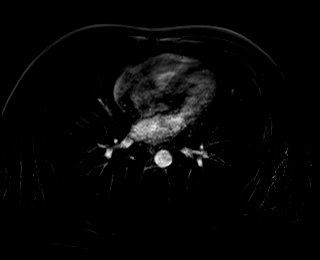

[Series 20: T1 dynamic fat-sat post-contrast · axial · 3.0mm · 1.19mm/px · z∈[-183,+54]mm · 3 of 80 slices shown (2 of 4)]
[im 1/80]
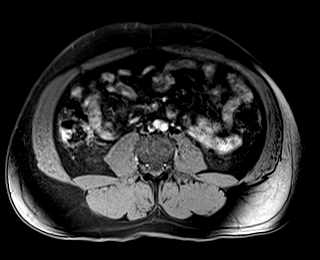
[im 40/80]
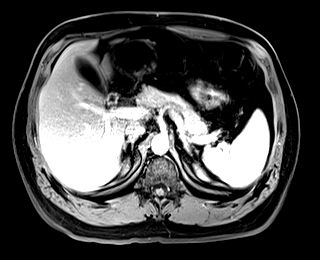
[im 80/80]
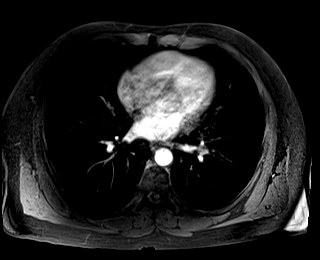

[Series 21: T1 dynamic fat-sat · axial · 3.0mm · 1.19mm/px · z∈[-183,+54]mm · 3 of 80 slices shown (3 of 5)]
[im 1/80]
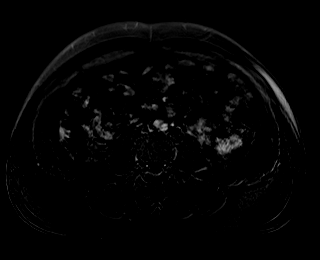
[im 40/80]
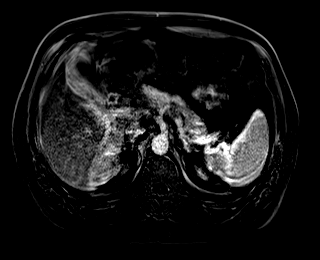
[im 80/80]
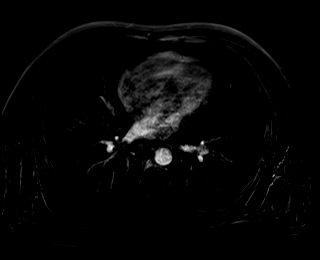

[Series 22: T1 dynamic fat-sat post-contrast · axial · 3.0mm · 1.19mm/px · z∈[-183,+54]mm · 3 of 80 slices shown (3 of 4)]
[im 1/80]
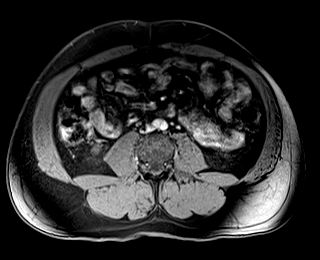
[im 40/80]
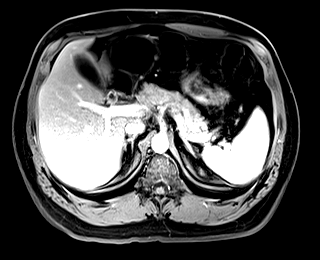
[im 80/80]
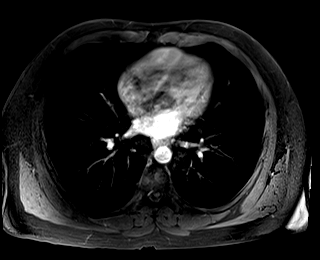

[Series 23: T1 dynamic fat-sat · axial · 3.0mm · 1.19mm/px · z∈[-183,+54]mm · 3 of 80 slices shown (4 of 5)]
[im 1/80]
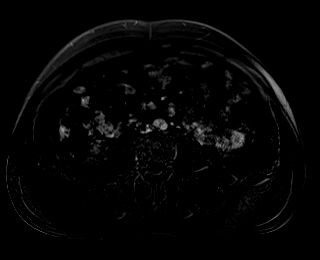
[im 40/80]
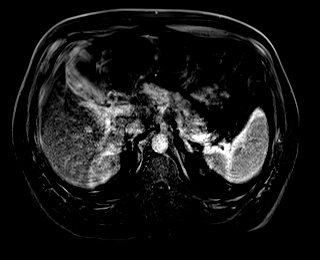
[im 80/80]
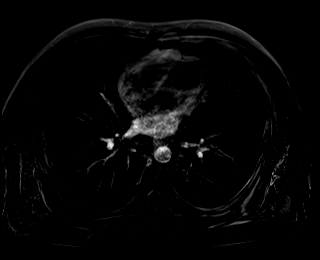

[Series 24: T1 dynamic post-contrast · coronal · 3.0mm · 1.31mm/px · 3 of 80 slices shown]
[im 1/80]
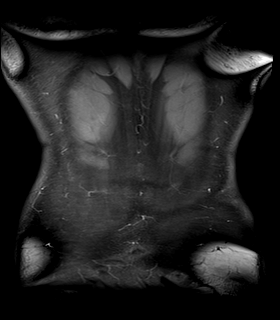
[im 40/80]
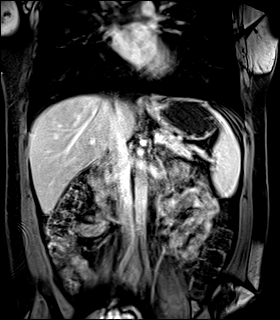
[im 80/80]
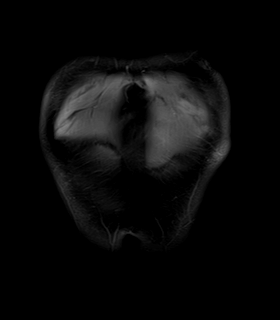

[Series 25: T1 dynamic fat-sat post-contrast · axial · 3.0mm · 1.19mm/px · z∈[-183,+54]mm · 3 of 80 slices shown (4 of 4)]
[im 1/80]
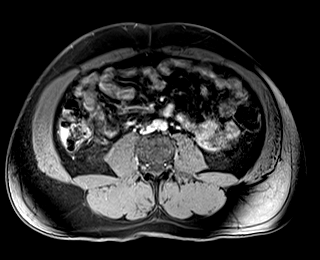
[im 40/80]
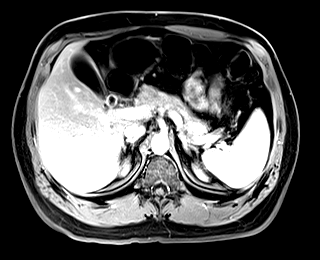
[im 80/80]
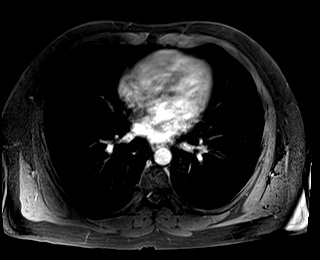

[Series 26: T1 dynamic fat-sat · axial · 3.0mm · 1.19mm/px · z∈[-183,+54]mm · 3 of 80 slices shown (5 of 5)]
[im 1/80]
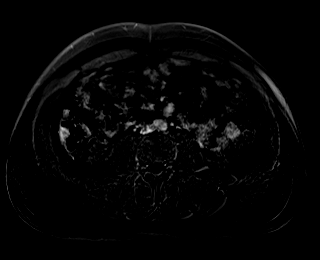
[im 40/80]
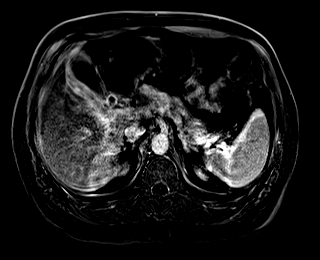
[im 80/80]
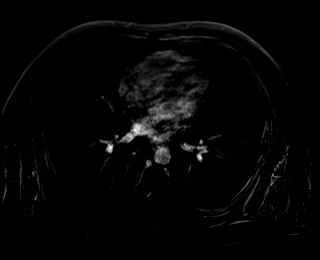

[44 of 48 positions shown; findings below may reference images not displayed]

FINDINGS: Lower chest: No acute findings.

Hepatobiliary: No hepatic masses identified. Normal hepatic signal
intensity. Numerous tiny gallstones are seen nearly completely
filling the gallbladder, however there is no evidence of gallbladder
wall thickening or pericholecystic inflammatory changes.

Some respiratory motion artifact is noted. Mild dilatation of common
bile duct is seen measuring 6 mm in diameter. Two sub-cm calculi are
seen within the distal common bile duct.

Pancreas: No mass or inflammatory changes. No evidence of pancreatic
ductal dilatation or pancreas divisum.

Spleen:  Within normal limits in size and appearance.

Adrenals/Urinary Tract: No masses identified. No evidence of
hydronephrosis.

Stomach/Bowel: Visualized portion unremarkable.

Vascular/Lymphatic: No pathologically enlarged lymph nodes
identified. No abdominal aortic aneurysm.

Other:  None.

Musculoskeletal:  No suspicious bone lesions identified.
IMPRESSION: Cholelithiasis.  No radiographic evidence of acute cholecystitis.

Mild biliary ductal dilatation measuring 6 mm, with 2 tiny sub-cm
calculi within the distal common bile duct.

## 2022-11-12 DIAGNOSIS — R197 Diarrhea, unspecified: Secondary | ICD-10-CM | POA: Diagnosis not present

## 2023-02-28 ENCOUNTER — Other Ambulatory Visit: Payer: Self-pay

## 2023-02-28 DIAGNOSIS — Z13220 Encounter for screening for lipoid disorders: Secondary | ICD-10-CM | POA: Diagnosis not present

## 2023-02-28 DIAGNOSIS — F40243 Fear of flying: Secondary | ICD-10-CM | POA: Diagnosis not present

## 2023-02-28 DIAGNOSIS — Z131 Encounter for screening for diabetes mellitus: Secondary | ICD-10-CM | POA: Diagnosis not present

## 2023-02-28 DIAGNOSIS — Z0000 Encounter for general adult medical examination without abnormal findings: Secondary | ICD-10-CM | POA: Diagnosis not present

## 2023-02-28 DIAGNOSIS — F411 Generalized anxiety disorder: Secondary | ICD-10-CM | POA: Diagnosis not present

## 2023-02-28 DIAGNOSIS — Z1331 Encounter for screening for depression: Secondary | ICD-10-CM | POA: Diagnosis not present

## 2023-02-28 MED ORDER — LORAZEPAM 0.5 MG PO TABS
0.5000 mg | ORAL_TABLET | Freq: Two times a day (BID) | ORAL | 0 refills | Status: AC
  Filled 2023-02-28: qty 6, 3d supply, fill #0

## 2023-02-28 MED ORDER — FLUOXETINE HCL 10 MG PO CAPS
ORAL_CAPSULE | ORAL | 0 refills | Status: DC
  Filled 2023-02-28: qty 90, 45d supply, fill #0

## 2023-04-02 ENCOUNTER — Other Ambulatory Visit: Payer: Self-pay

## 2023-04-04 ENCOUNTER — Other Ambulatory Visit: Payer: Self-pay

## 2023-04-04 DIAGNOSIS — F40243 Fear of flying: Secondary | ICD-10-CM | POA: Diagnosis not present

## 2023-04-04 DIAGNOSIS — F411 Generalized anxiety disorder: Secondary | ICD-10-CM | POA: Diagnosis not present

## 2023-04-04 MED ORDER — FLUOXETINE HCL 10 MG PO CAPS
30.0000 mg | ORAL_CAPSULE | Freq: Every day | ORAL | 3 refills | Status: AC
  Filled 2023-04-04: qty 270, 90d supply, fill #0

## 2023-04-17 ENCOUNTER — Other Ambulatory Visit (HOSPITAL_BASED_OUTPATIENT_CLINIC_OR_DEPARTMENT_OTHER): Payer: Self-pay

## 2023-07-25 DIAGNOSIS — U071 COVID-19: Secondary | ICD-10-CM | POA: Diagnosis not present

## 2023-09-02 DIAGNOSIS — Z1331 Encounter for screening for depression: Secondary | ICD-10-CM | POA: Diagnosis not present

## 2023-09-02 DIAGNOSIS — Z1330 Encounter for screening examination for mental health and behavioral disorders, unspecified: Secondary | ICD-10-CM | POA: Diagnosis not present

## 2023-09-02 DIAGNOSIS — J22 Unspecified acute lower respiratory infection: Secondary | ICD-10-CM | POA: Diagnosis not present

## 2024-05-18 DIAGNOSIS — Z130 Encounter for screening for diseases of the blood and blood-forming organs and certain disorders involving the immune mechanism: Secondary | ICD-10-CM | POA: Diagnosis not present

## 2024-05-18 DIAGNOSIS — F411 Generalized anxiety disorder: Secondary | ICD-10-CM | POA: Diagnosis not present

## 2024-05-18 DIAGNOSIS — E66811 Obesity, class 1: Secondary | ICD-10-CM | POA: Diagnosis not present

## 2024-05-18 DIAGNOSIS — F40243 Fear of flying: Secondary | ICD-10-CM | POA: Diagnosis not present

## 2024-05-18 DIAGNOSIS — Z13220 Encounter for screening for lipoid disorders: Secondary | ICD-10-CM | POA: Diagnosis not present

## 2024-05-18 DIAGNOSIS — Z1331 Encounter for screening for depression: Secondary | ICD-10-CM | POA: Diagnosis not present

## 2024-05-18 DIAGNOSIS — Z131 Encounter for screening for diabetes mellitus: Secondary | ICD-10-CM | POA: Diagnosis not present

## 2024-05-18 DIAGNOSIS — Z0000 Encounter for general adult medical examination without abnormal findings: Secondary | ICD-10-CM | POA: Diagnosis not present

## 2024-05-18 DIAGNOSIS — Z1211 Encounter for screening for malignant neoplasm of colon: Secondary | ICD-10-CM | POA: Diagnosis not present

## 2024-05-18 DIAGNOSIS — Z1329 Encounter for screening for other suspected endocrine disorder: Secondary | ICD-10-CM | POA: Diagnosis not present

## 2024-05-25 DIAGNOSIS — Z1211 Encounter for screening for malignant neoplasm of colon: Secondary | ICD-10-CM | POA: Diagnosis not present
